# Patient Record
Sex: Female | Born: 1995 | Race: Black or African American | Hispanic: No | Marital: Single | Smoking: Never smoker
Health system: Southern US, Community
[De-identification: ages and names within clinical notes are randomized; demographics above are authoritative.]

## PROBLEM LIST (undated history)

## (undated) DIAGNOSIS — J45909 Unspecified asthma, uncomplicated: Secondary | ICD-10-CM

---
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
## 2019-08-27 ENCOUNTER — Emergency Department (HOSPITAL_COMMUNITY): Payer: BC Managed Care – PPO

## 2019-08-27 ENCOUNTER — Encounter (HOSPITAL_COMMUNITY): Payer: Self-pay | Admitting: *Deleted

## 2019-08-27 ENCOUNTER — Emergency Department (HOSPITAL_COMMUNITY)
Admission: EM | Admit: 2019-08-27 | Discharge: 2019-08-27 | Disposition: A | Discharge status: Home / Self Care | Payer: BC Managed Care – PPO | Attending: Emergency Medicine | Admitting: Emergency Medicine

## 2019-08-27 ENCOUNTER — Other Ambulatory Visit: Payer: Self-pay

## 2019-08-27 DIAGNOSIS — B373 Candidiasis of vulva and vagina: Secondary | ICD-10-CM | POA: Insufficient documentation

## 2019-08-27 DIAGNOSIS — Z79899 Other long term (current) drug therapy: Secondary | ICD-10-CM | POA: Diagnosis not present

## 2019-08-27 DIAGNOSIS — B379 Candidiasis, unspecified: Secondary | ICD-10-CM

## 2019-08-27 DIAGNOSIS — J45909 Unspecified asthma, uncomplicated: Secondary | ICD-10-CM | POA: Diagnosis not present

## 2019-08-27 DIAGNOSIS — N938 Other specified abnormal uterine and vaginal bleeding: Secondary | ICD-10-CM | POA: Insufficient documentation

## 2019-08-27 DIAGNOSIS — N939 Abnormal uterine and vaginal bleeding, unspecified: Secondary | ICD-10-CM

## 2019-08-27 HISTORY — DX: Unspecified asthma, uncomplicated: J45.909

## 2019-08-27 LAB — URINALYSIS, ROUTINE W REFLEX MICROSCOPIC
Bacteria, UA: NONE SEEN
Bilirubin Urine: NEGATIVE
Glucose, UA: NEGATIVE mg/dL
Ketones, ur: NEGATIVE mg/dL
Nitrite: NEGATIVE
Protein, ur: NEGATIVE mg/dL
RBC / HPF: >50 RBC/hpf — ABNORMAL HIGH (ref 0–5)
Specific Gravity, Urine: 1.019 (ref 1.005–1.030)
pH: 6 (ref 5.0–8.0)

## 2019-08-27 LAB — WET PREP, GENITAL
Clue Cells Wet Prep HPF POC: NONE SEEN
Sperm: NONE SEEN
Trich, Wet Prep: NONE SEEN

## 2019-08-27 LAB — COMPREHENSIVE METABOLIC PANEL
ALT: 25 U/L (ref 0–44)
AST: 23 U/L (ref 15–41)
Albumin: 3.4 g/dL — ABNORMAL LOW (ref 3.5–5.0)
Alkaline Phosphatase: 55 U/L (ref 38–126)
Anion gap: 9 (ref 5–15)
BUN: 8 mg/dL (ref 6–20)
CO2: 20 mmol/L — ABNORMAL LOW (ref 22–32)
Calcium: 8.9 mg/dL (ref 8.9–10.3)
Chloride: 107 mmol/L (ref 98–111)
Creatinine, Ser: 0.87 mg/dL (ref 0.44–1.00)
GFR calc Af Amer: >60 mL/min (ref >60)
GFR calc non Af Amer: >60 mL/min (ref >60)
Glucose, Bld: 152 mg/dL — ABNORMAL HIGH (ref 70–99)
Potassium: 3.8 mmol/L (ref 3.5–5.1)
Sodium: 136 mmol/L (ref 135–145)
Total Bilirubin: 0.2 mg/dL — ABNORMAL LOW (ref 0.3–1.2)
Total Protein: 6.8 g/dL (ref 6.5–8.1)

## 2019-08-27 LAB — CBC
HCT: 42.5 % (ref 36.0–46.0)
Hemoglobin: 13.5 g/dL (ref 12.0–15.0)
MCH: 27.3 pg (ref 26.0–34.0)
MCHC: 31.8 g/dL (ref 30.0–36.0)
MCV: 86 fL (ref 80.0–100.0)
Platelets: 396 10*3/uL (ref 150–400)
RBC: 4.94 MIL/uL (ref 3.87–5.11)
RDW: 14 % (ref 11.5–15.5)
WBC: 8.8 10*3/uL (ref 4.0–10.5)
nRBC: 0 % (ref 0.0–0.2)

## 2019-08-27 LAB — I-STAT BETA HCG BLOOD, ED (MC, WL, AP ONLY): I-stat hCG, quantitative: <5 m[IU]/mL (ref <5)

## 2019-08-27 LAB — LIPASE, BLOOD: Lipase: 33 U/L (ref 11–51)

## 2019-08-27 MED ORDER — SODIUM CHLORIDE 0.9% FLUSH: 3.0000 mL | Freq: Once | INTRAVENOUS | Status: DC

## 2019-08-27 MED ORDER — FLUCONAZOLE 150 MG PO TABS: ORAL_TABLET | ORAL | 0 refills | Status: DC

## 2019-08-27 NOTE — ED Provider Notes (Signed)
[facility] EMERGENCY DEPARTMENT Provider Note   CSN: [id number] Arrival date & time: 08/27/19  1603     History Chief Complaint  Patient presents with  . Vaginal Bleeding    Brenda Acevedo is a 24 y.o. female who presents to the ED today complaining of vaginal bleeding x 10-11 days.  She reports she began having her period on 12/30-12/31.  She states that she typically has around the 27th of every month that she was a couple of days late.  She states that since then she has been had continued bleeding although it has somewhat external waned in severity.  Patient states that she began passing very heavy clots a couple of days ago and then yesterday noticed what appeared to be a fleshy type of substance which concerned her.  She states that since yesterday she has had some suprapubic abdominal pain and pelvic pain that she describes as a pulling sensation.  She denies any new nausea or vomiting.  Patient states that she restarted her birth control at the beginning of December after being off for 6 months due to pharmacy issues with covering her birth control.  States she is sexually active with one female partner but reports that they always use protection.  She states that there could be a risk that she is pregnant but it would be very slim as they always use a condom.  She is not concerned about STIs at this time.  She denies fevers, chills, nausea, vomiting, chest pain, shortness of breath, syncope, dysuria, urinary frequency, any other associated symptoms.   The history is provided by the patient.       Past Medical History:  Diagnosis Date  . Asthma     There are no problems to display for this patient.   History reviewed. No pertinent surgical history.   OB History   No obstetric history on file.     No family history on file.  Social History   Tobacco Use  . Smoking status: Never Smoker  . Smokeless tobacco: Never Used  Substance Use Topics  .  Alcohol use: No  . Drug use: No    Home Medications Prior to Admission medications   Medication Sig Start Date End Date Taking? Authorizing Provider  albuterol (PROAIR HFA) 108 (90 BASE) MCG/ACT inhaler Inhale 2 puffs into the lungs every 6 (six) hours as needed for wheezing.    [provider]  fluconazole (DIFLUCAN) 150 MG tablet Take 1 tablet by mouth on Day 1. If symptoms persistent take 1 tablet by mouth on Day 4. 08/27/19   Tanda Rockers, PA-C  meloxicam (MOBIC) 15 MG tablet Take 1 tablet (15 mg total) by mouth daily as needed for pain. 03/19/13   Rodolph Bong, MD    Allergies    Patient has no known allergies.  Review of Systems   Review of Systems  Constitutional: Negative for chills and fever.  Respiratory: Negative for shortness of breath.   Cardiovascular: Negative for chest pain.  Gastrointestinal: Positive for abdominal pain. Negative for constipation, diarrhea, nausea and vomiting.  Genitourinary: Positive for menstrual problem, pelvic pain and vaginal bleeding. Negative for dysuria and frequency.  All other systems reviewed and are negative.   Physical Exam Updated Vital Signs BP 136/80   Pulse (!) 111   Temp 98 F (36.7 C) (Oral)   Resp 16   Ht 5\' 2"  (1.575 m)   Wt 63.5 kg   LMP 07/10/2019   SpO2  100%   BMI 25.61 kg/m   Physical Exam Vitals and nursing note reviewed.  Constitutional:      Appearance: She is not ill-appearing.  HENT:     Head: Normocephalic and atraumatic.  Eyes:     Conjunctiva/sclera: Conjunctivae normal.  Cardiovascular:     Rate and Rhythm: Normal rate and regular rhythm.     Pulses: Normal pulses.  Pulmonary:     Effort: Pulmonary effort is normal.     Breath sounds: Normal breath sounds. No wheezing, rhonchi or rales.  Abdominal:     Palpations: Abdomen is soft.     Tenderness: There is no abdominal tenderness. There is no right CVA tenderness, left CVA tenderness, guarding or rebound.  Genitourinary:    Comments:  Chaperone present for exam Gunnar Bulla, RN No rashes, lesions, or tenderness to external genitalia. No erythema, injury, or tenderness to vaginal mucosa. Small amount of blood in vaginal vault and from Os. Os is closed. Tenderness bilateral to adnexa. No CMT, cervical friability, or discharge from cervical os. Musculoskeletal:     Cervical back: Neck supple.  Skin:    General: Skin is warm and dry.  Neurological:     Mental Status: She is alert.     ED Results / Procedures / Treatments   Labs (all labs ordered are listed, but only abnormal results are displayed) Labs Reviewed  WET PREP, GENITAL - Abnormal; Notable for the following components:      Result Value   Yeast Wet Prep HPF POC PRESENT (*)    WBC, Wet Prep HPF POC FEW (*)    All other components within normal limits  COMPREHENSIVE METABOLIC PANEL - Abnormal; Notable for the following components:   CO2 20 (*)    Glucose, Bld 152 (*)    Albumin 3.4 (*)    Total Bilirubin 0.2 (*)    All other components within normal limits  URINALYSIS, ROUTINE W REFLEX MICROSCOPIC - Abnormal; Notable for the following components:   Hgb urine dipstick LARGE (*)    Leukocytes,Ua TRACE (*)    RBC / HPF >50 (*)    All other components within normal limits  LIPASE, BLOOD  CBC  I-STAT BETA HCG BLOOD, ED ([facility], [facility], [facility] ONLY)  GC/CHLAMYDIA PROBE AMP ([place]) NOT AT [facility]    EKG None  Radiology US Transvaginal Non-OB  Result Date: 08/27/2019 CLINICAL DATA:  Pelvic pain.  Abnormal uterine bleeding. EXAM: TRANSABDOMINAL AND TRANSVAGINAL ULTRASOUND OF PELVIS DOPPLER ULTRASOUND OF OVARIES TECHNIQUE: Both transabdominal and transvaginal ultrasound examinations of the pelvis were performed. Transabdominal technique was performed for global imaging of the pelvis including uterus, ovaries, adnexal regions, and pelvic cul-de-sac. It was necessary to proceed with endovaginal exam following the transabdominal exam to visualize the ovaries and  endometrial stripe. Color and duplex Doppler ultrasound was utilized to evaluate blood flow to the ovaries. COMPARISON:  None. FINDINGS: Uterus Measurements: 7.4 x 4.7 x 0.4 cm = volume: 98 mL. No fibroids or other mass visualized. Endometrium Thickness: 21 mm.  No focal abnormality visualized. Right ovary Measurements: 3.5 x 1.6 x 2.5 cm = volume: 7 point mL. Normal appearance/no adnexal mass. Left ovary Measurements: 2.9 x 1.7 x 2.6 cm = volume: 6.7 mL. Normal appearance/no adnexal mass. Pulsed Doppler evaluation of both ovaries demonstrates normal low-resistance arterial and venous waveforms. Other findings There is a small amount of free fluid about the right adnexa. IMPRESSION: 1. No evidence for ovarian torsion. 2. Thickened endometrial stripe measuring approximately  2.1 cm. Consider follow-up pelvic ultrasound in 6-8 weeks in the early proliferative phase of the menstrual cycle after initiation of hormonal or medical therapy. If the abnormal uterine bleeding is already refractory to hormonal or medical therapy, consider endometrial biopsy, as clinically warranted. Electronically Signed   By: Katherine Mantle M.D.   On: 08/27/2019 18:44   US Pelvis Complete  Result Date: 08/27/2019 CLINICAL DATA:  Pelvic pain.  Abnormal uterine bleeding. EXAM: TRANSABDOMINAL AND TRANSVAGINAL ULTRASOUND OF PELVIS DOPPLER ULTRASOUND OF OVARIES TECHNIQUE: Both transabdominal and transvaginal ultrasound examinations of the pelvis were performed. Transabdominal technique was performed for global imaging of the pelvis including uterus, ovaries, adnexal regions, and pelvic cul-de-sac. It was necessary to proceed with endovaginal exam following the transabdominal exam to visualize the ovaries and endometrial stripe. Color and duplex Doppler ultrasound was utilized to evaluate blood flow to the ovaries. COMPARISON:  None. FINDINGS: Uterus Measurements: 7.4 x 4.7 x 0.4 cm = volume: 98 mL. No fibroids or other mass visualized.  Endometrium Thickness: 21 mm.  No focal abnormality visualized. Right ovary Measurements: 3.5 x 1.6 x 2.5 cm = volume: 7 point mL. Normal appearance/no adnexal mass. Left ovary Measurements: 2.9 x 1.7 x 2.6 cm = volume: 6.7 mL. Normal appearance/no adnexal mass. Pulsed Doppler evaluation of both ovaries demonstrates normal low-resistance arterial and venous waveforms. Other findings There is a small amount of free fluid about the right adnexa. IMPRESSION: 1. No evidence for ovarian torsion. 2. Thickened endometrial stripe measuring approximately 2.1 cm. Consider follow-up pelvic ultrasound in 6-8 weeks in the early proliferative phase of the menstrual cycle after initiation of hormonal or medical therapy. If the abnormal uterine bleeding is already refractory to hormonal or medical therapy, consider endometrial biopsy, as clinically warranted. Electronically Signed   By: Katherine Mantle M.D.   On: 08/27/2019 18:44   [place] Art/Ven Flow Abd Pelv Doppler  Result Date: 08/27/2019 CLINICAL DATA:  Pelvic pain.  Abnormal uterine bleeding. EXAM: TRANSABDOMINAL AND TRANSVAGINAL ULTRASOUND OF PELVIS DOPPLER ULTRASOUND OF OVARIES TECHNIQUE: Both transabdominal and transvaginal ultrasound examinations of the pelvis were performed. Transabdominal technique was performed for global imaging of the pelvis including uterus, ovaries, adnexal regions, and pelvic cul-de-sac. It was necessary to proceed with endovaginal exam following the transabdominal exam to visualize the ovaries and endometrial stripe. Color and duplex Doppler ultrasound was utilized to evaluate blood flow to the ovaries. COMPARISON:  None. FINDINGS: Uterus Measurements: 7.4 x 4.7 x 0.4 cm = volume: 98 mL. No fibroids or other mass visualized. Endometrium Thickness: 21 mm.  No focal abnormality visualized. Right ovary Measurements: 3.5 x 1.6 x 2.5 cm = volume: 7 point mL. Normal appearance/no adnexal mass. Left ovary Measurements: 2.9 x 1.7 x 2.6 cm = volume:  6.7 mL. Normal appearance/no adnexal mass. Pulsed Doppler evaluation of both ovaries demonstrates normal low-resistance arterial and venous waveforms. Other findings There is a small amount of free fluid about the right adnexa. IMPRESSION: 1. No evidence for ovarian torsion. 2. Thickened endometrial stripe measuring approximately 2.1 cm. Consider follow-up pelvic ultrasound in 6-8 weeks in the early proliferative phase of the menstrual cycle after initiation of hormonal or medical therapy. If the abnormal uterine bleeding is already refractory to hormonal or medical therapy, consider endometrial biopsy, as clinically warranted. Electronically Signed   By: Katherine Mantle M.D.   On: 08/27/2019 18:44    Procedures Procedures (including critical care time)  Medications Ordered in ED Medications  sodium chloride flush (NS) 0.9 % injection 3 mL (  has no administration in time range)    ED Course  I have reviewed the triage vital signs and the nursing notes.  Pertinent labs & imaging results that were available during my care of the patient were reviewed by me and considered in my medical decision making (see chart for details).  24 year old female presents to the ED today complaining of excessive vaginal bleeding for the past 10 to 11 days.  Noticed a fleshy substance yesterday which concerned her.  Patient is sexually active with one female partner but states they always use protection.  She is going through about 5-6 medium-size pads per day.  Had recently restart birth control at the beginning of December after being on for 6 months. Pt without any symptoms regarding blood loss including chest pain, SOB, dizziness/lightheadedness.   On exam patient has no abdominal tenderness.  She has bilateral adnexal tenderness on pelvic exam.  There is no obvious vaginal discharge appreciated.  There is a small amount of blood in the vault and from os although no large blood clots or products of conception  visualized.  Os is closed.  hCG negative today.   CBC without leukocytosis.  Hemoglobin stable at 13.5.  No electrolyte abnormalities on CMP today.  Lipase within normal limits.  Awaiting urinalysis, wet prep.  Patient declined HIV and syphilis testing today.   Given tenderness to both adnexa will obtain pelvic ultrasound at this time, question history of ovarian cyst with possible hemorrhagic ruptured cyst versus other etiology.  Patient does not have any CMT tenderness to suggest PID.  Again she is not pregnant I am not concerned about an ectopic pregnancy today.   Wet prep positive for yeast; will treat.   Ultrasound with endometrial stripe measuring 2.1 cm, recommend repeat imaging in 6-8 weeks. Will have patient follow up with OBGYN regarding dysfunctional uterine bleeding and enlarged endometrial stripe. Given she is not having any symptoms related to her blood loss and her hgb is stable feel she is stable for follow up outpatient. Strict return precautions discussed. Pt is in agreement with plan and stable for discharge home.   Clinical Course as of Aug 27 1851  Sun Aug 27, 2019  1737 I-stat hCG, quantitative: <5.0 [MV]  1737 Hemoglobin: 13.5 [MV]  1754 Yeast Wet Prep HPF POC(!): PRESENT [MV]    Clinical Course User Index [MV] Eustaquio Maize, PA-C   MDM Rules/Calculators/A&P                       Final Clinical Impression(s) / ED Diagnoses Final diagnoses:  Vaginal bleeding  Abnormal uterine bleeding  Yeast infection    Rx / DC Orders ED Discharge Orders         Ordered    fluconazole (DIFLUCAN) 150 MG tablet     08/27/19 1803           Discharge Instructions     Your labwork was reassuring today. Your ultrasound did show an enlarged endometrial lining; they recommend that you have a repeat ultrasound in 6-8 weeks.   Please follow up with OBGYN regarding your excessive bleeding. [facility] - please call to find out when their walk in  hours are.   I have sent in medication for a yeast infection as you tested positive for this today. Please take as prescribed.   You have been tested for gonorrhea and chlamydia and will receive a call in 2-3 days IF you test  positive.   Return to the ED for any worsening symptoms including worsening pain, dizziness/lightheadedness, passing out, blurry vision/double vision, shortness of breath.        Tanda RockersVenter, Pamalee Marcoe, PA-C 08/27/19 1853    Jacalyn LefevreHaviland, Julie, MD 08/28/19 0001

## 2019-08-27 NOTE — ED Triage Notes (Signed)
The pt is c/o vaginal bleeding for 12 days with clotting  Lower abd pain   She was off birth control pills for 6 months then in December she started taking an old rx of brith control pills    Her last bc pills was 2 days ago  Last normal period was the end of november

## 2019-08-27 NOTE — Discharge Instructions (Addendum)
Your labwork was reassuring today. Your ultrasound did show an enlarged endometrial lining; they recommend that you have a repeat ultrasound in 6-8 weeks.   Please follow up with OBGYN regarding your excessive bleeding. [facility] - please call to find out when their walk in hours are.   I have sent in medication for a yeast infection as you tested positive for this today. Please take as prescribed.   You have been tested for gonorrhea and chlamydia and will receive a call in 2-3 days IF you test positive.   Return to the ED for any worsening symptoms including worsening pain, dizziness/lightheadedness, passing out, blurry vision/double vision, shortness of breath.

## 2019-08-29 LAB — GC/CHLAMYDIA PROBE AMP (~~LOC~~) NOT AT ARMC
Chlamydia: POSITIVE — AB
Neisseria Gonorrhea: NEGATIVE

## 2019-08-30 ENCOUNTER — Telehealth: Payer: Self-pay | Admitting: Medical

## 2019-08-30 DIAGNOSIS — A749 Chlamydial infection, unspecified: Secondary | ICD-10-CM

## 2019-08-30 MED ORDER — AZITHROMYCIN 250 MG PO TABS: 1000.0000 mg | ORAL_TABLET | Freq: Once | ORAL | 0 refills | Status: AC

## 2019-08-30 NOTE — Telephone Encounter (Addendum)
Brenda Acevedo tested positive for  Chlamydia. Patient was called by RN and allergies and pharmacy confirmed. Rx sent to pharmacy of choice.   Marny Lowenstein, PA-C 08/30/2019 2:56 PM       ----- Message from Kathe Becton, RN sent at 08/30/2019  1:46 PM EST ----- This patient tested positive for :  Chlamydia   She "has NKDA",  I have informed the patient of her results and confirmed her pharmacy is correct in her chart. Please send Rx.   Thank you,   Kathe Becton, RN   Results faxed to [facility] Department.

## 2020-07-17 ENCOUNTER — Inpatient Hospital Stay (HOSPITAL_COMMUNITY)
Admission: EM | Admit: 2020-07-17 | Discharge: 2020-07-19 | DRG: 203 | Disposition: A | Discharge status: Home / Self Care | Payer: BC Managed Care – PPO | Attending: Internal Medicine | Admitting: Internal Medicine

## 2020-07-17 ENCOUNTER — Encounter (HOSPITAL_COMMUNITY): Payer: Self-pay | Admitting: *Deleted

## 2020-07-17 ENCOUNTER — Other Ambulatory Visit: Payer: Self-pay

## 2020-07-17 ENCOUNTER — Emergency Department (HOSPITAL_COMMUNITY): Payer: BC Managed Care – PPO

## 2020-07-17 DIAGNOSIS — J449 Chronic obstructive pulmonary disease, unspecified: Secondary | ICD-10-CM | POA: Diagnosis present

## 2020-07-17 DIAGNOSIS — R062 Wheezing: Secondary | ICD-10-CM

## 2020-07-17 DIAGNOSIS — J45902 Unspecified asthma with status asthmaticus: Secondary | ICD-10-CM | POA: Diagnosis not present

## 2020-07-17 DIAGNOSIS — R0602 Shortness of breath: Secondary | ICD-10-CM | POA: Diagnosis not present

## 2020-07-17 DIAGNOSIS — D72829 Elevated white blood cell count, unspecified: Secondary | ICD-10-CM | POA: Diagnosis present

## 2020-07-17 DIAGNOSIS — Z91013 Allergy to seafood: Secondary | ICD-10-CM

## 2020-07-17 DIAGNOSIS — Z20822 Contact with and (suspected) exposure to covid-19: Secondary | ICD-10-CM | POA: Diagnosis present

## 2020-07-17 DIAGNOSIS — J45901 Unspecified asthma with (acute) exacerbation: Secondary | ICD-10-CM | POA: Diagnosis present

## 2020-07-17 DIAGNOSIS — Z79899 Other long term (current) drug therapy: Secondary | ICD-10-CM

## 2020-07-17 DIAGNOSIS — Z7952 Long term (current) use of systemic steroids: Secondary | ICD-10-CM

## 2020-07-17 DIAGNOSIS — R0902 Hypoxemia: Secondary | ICD-10-CM | POA: Diagnosis present

## 2020-07-17 LAB — BASIC METABOLIC PANEL
Anion gap: 15 (ref 5–15)
BUN: 13 mg/dL (ref 6–20)
CO2: 20 mmol/L — ABNORMAL LOW (ref 22–32)
Calcium: 9.6 mg/dL (ref 8.9–10.3)
Chloride: 100 mmol/L (ref 98–111)
Creatinine, Ser: 1.08 mg/dL — ABNORMAL HIGH (ref 0.44–1.00)
GFR, Estimated: >60 mL/min (ref >60)
Glucose, Bld: 153 mg/dL — ABNORMAL HIGH (ref 70–99)
Potassium: 3.4 mmol/L — ABNORMAL LOW (ref 3.5–5.1)
Sodium: 135 mmol/L (ref 135–145)

## 2020-07-17 LAB — CBC WITH DIFFERENTIAL/PLATELET
Abs Immature Granulocytes: 0.18 10*3/uL — ABNORMAL HIGH (ref 0.00–0.07)
Basophils Absolute: 0 10*3/uL (ref 0.0–0.1)
Basophils Relative: 0 %
Eosinophils Absolute: 0 10*3/uL (ref 0.0–0.5)
Eosinophils Relative: 0 %
HCT: 41.6 % (ref 36.0–46.0)
Hemoglobin: 13.4 g/dL (ref 12.0–15.0)
Immature Granulocytes: 1 %
Lymphocytes Relative: 3 %
Lymphs Abs: 0.8 10*3/uL (ref 0.7–4.0)
MCH: 26 pg (ref 26.0–34.0)
MCHC: 32.2 g/dL (ref 30.0–36.0)
MCV: 80.6 fL (ref 80.0–100.0)
Monocytes Absolute: 0.5 10*3/uL (ref 0.1–1.0)
Monocytes Relative: 2 %
Neutro Abs: 23.3 10*3/uL — ABNORMAL HIGH (ref 1.7–7.7)
Neutrophils Relative %: 94 %
Platelets: 410 10*3/uL — ABNORMAL HIGH (ref 150–400)
RBC: 5.16 MIL/uL — ABNORMAL HIGH (ref 3.87–5.11)
RDW: 15.6 % — ABNORMAL HIGH (ref 11.5–15.5)
WBC: 24.8 10*3/uL — ABNORMAL HIGH (ref 4.0–10.5)
nRBC: 0 % (ref 0.0–0.2)

## 2020-07-17 LAB — RESP PANEL BY RT-PCR (FLU A&B, COVID) ARPGX2
Influenza A by PCR: NEGATIVE
Influenza B by PCR: NEGATIVE
SARS Coronavirus 2 by RT PCR: NEGATIVE

## 2020-07-17 LAB — I-STAT BETA HCG BLOOD, ED (MC, WL, AP ONLY): I-stat hCG, quantitative: <5 m[IU]/mL (ref <5)

## 2020-07-17 LAB — D-DIMER, QUANTITATIVE: D-Dimer, Quant: 0.43 ug/mL-FEU (ref 0.00–0.50)

## 2020-07-17 MED ORDER — IPRATROPIUM BROMIDE 0.02 % IN SOLN
0.5000 mg | Freq: Once | RESPIRATORY_TRACT | Status: AC
  Administered 2020-07-17: 0.5 mg via RESPIRATORY_TRACT
  Filled 2020-07-17: qty 2.5

## 2020-07-17 MED ORDER — ENOXAPARIN SODIUM 40 MG/0.4ML ~~LOC~~ SOLN
40.0000 mg | SUBCUTANEOUS | Status: DC
  Administered 2020-07-18 – 2020-07-19 (×2): 40 mg via SUBCUTANEOUS
  Filled 2020-07-17 (×2): qty 0.4

## 2020-07-17 MED ORDER — LEVALBUTEROL HCL 0.63 MG/3ML IN NEBU
0.6300 mg | INHALATION_SOLUTION | RESPIRATORY_TRACT | Status: DC | PRN
  Administered 2020-07-18: 0.63 mg via RESPIRATORY_TRACT
  Filled 2020-07-17: qty 3

## 2020-07-17 MED ORDER — MAGNESIUM SULFATE 2 GM/50ML IV SOLN
2.0000 g | Freq: Once | INTRAVENOUS | Status: AC
  Administered 2020-07-17: 2 g via INTRAVENOUS
  Filled 2020-07-17: qty 50

## 2020-07-17 MED ORDER — METHYLPREDNISOLONE SODIUM SUCC 125 MG IJ SOLR
60.0000 mg | Freq: Two times a day (BID) | INTRAMUSCULAR | Status: DC
  Administered 2020-07-18: 60 mg via INTRAVENOUS
  Filled 2020-07-17: qty 2

## 2020-07-17 MED ORDER — MOMETASONE FURO-FORMOTEROL FUM 200-5 MCG/ACT IN AERO
2.0000 | INHALATION_SPRAY | Freq: Two times a day (BID) | RESPIRATORY_TRACT | Status: DC
  Administered 2020-07-18 – 2020-07-19 (×3): 2 via RESPIRATORY_TRACT
  Filled 2020-07-17: qty 8.8

## 2020-07-17 NOTE — ED Triage Notes (Signed)
BIB EMS tested yesterday Covid (-), breathing worse today, UC gave 3 breathing nebs, Sats 92-93 with movement. 18 L AC 125 mg Solumedrol. EMS gave 1 duo neb en route. 140-140/64-24-96% RA

## 2020-07-17 NOTE — H&P (Signed)
History and Physical    Brenda Acevedo HYQ:[id number] DOB: March 02, 1996 DOA: 07/17/2020  PCP: Patient, No Pcp Per  Patient coming from: Home  I have personally briefly reviewed patient's old medical records in [facility]  Chief Complaint: Asthma  HPI: Brenda Acevedo is a 24 y.o. female with medical history significant of asthma.  Pt having asthma attack with severe wheezing for past several days.  Given prednisone and inhalers x3 days ago.  Despite taking these, symptoms worsened and became severe earlier today.  Went in to UC.  Neb treatments, solumedrol given.  Symptoms persisted.  In to ED.  Neg outpt COVID test.  Not vaccinated.  ED Course: More neb treatments, magnesium.  Symptoms slightly better but desats with ambulation and S.Tach to 140.  Neg COVID and flu tests here.  CXR shows mild airway thickening and suggests acute or chronic reactive airway disease.  Is on hormonal birth control pill, non smoker.  No leg pain or swelling, no recent travel or prolonged immobility.   Review of Systems: As per HPI, otherwise all review of systems negative.  Past Medical History:  Diagnosis Date  . Asthma     History reviewed. No pertinent surgical history.   reports that she has never smoked. She has never used smokeless tobacco. She reports current alcohol use. She reports that she does not use drugs.  Allergies  Allergen Reactions  . Coconut (Cocos Nucifera) Allergy Skin Test Other (See Comments)  . Justicia Adhatoda (Malabar Nut Tree) [Justicia Adhatoda] Other (See Comments)    Tree nuts  . Shellfish Allergy Other (See Comments)    No family history on file. No sick contacts, no h/o clotting disorder.  Prior to Admission medications   Medication Sig Start Date End Date Taking? Authorizing Provider  albuterol (PROAIR HFA) 108 (90 BASE) MCG/ACT inhaler Inhale 2 puffs into the lungs every 6 (six) hours as needed for wheezing.   Yes [provider]    azithromycin (ZITHROMAX) 250 MG tablet Take 500 mg by mouth daily. Take for 5 days.   Yes [provider]  dexamethasone (DECADRON) 4 MG tablet Take 4-12 mg by mouth See admin instructions. Take 12mg  for 4 days, Then 8mg  for 4 day, Then 4mg  for 4 days.   Yes [provider]    Physical Exam: Vitals:   07/17/20 2145 07/17/20 2200 07/17/20 2205 07/17/20 2206  BP: 111/79 100/79 116/87   Pulse: (!) 125 (!) 130  (!) 128  Resp:    (!) 21  Temp:      TempSrc:      SpO2: 94% 93%  95%  Weight:      Height:        Constitutional: NAD, calm, comfortable Eyes: PERRL, lids and conjunctivae normal ENMT: Mucous membranes are moist. Posterior pharynx clear of any exudate or lesions.Normal dentition.  Neck: normal, supple, no masses, no thyromegaly Respiratory: clear to auscultation bilaterally, no wheezing, no crackles. Normal respiratory effort. No accessory muscle use.  Cardiovascular: Regular rate and rhythm, no murmurs / rubs / gallops. No extremity edema. 2+ pedal pulses. No carotid bruits.  Abdomen: no tenderness, no masses palpated. No hepatosplenomegaly. Bowel sounds positive.  Musculoskeletal: no clubbing / cyanosis. No joint deformity upper and lower extremities. Good ROM, no contractures. Normal muscle tone.  Skin: no rashes, lesions, ulcers. No induration Neurologic: CN 2-12 grossly intact. Sensation intact, DTR normal. Strength 5/5 in all 4.  Psychiatric: Normal judgment and insight. Alert and oriented x 3. Normal  mood.    Labs on Admission: I have personally reviewed following labs and imaging studies  CBC: Recent Labs  Lab 07/17/20 1920  WBC 24.8*  NEUTROABS 23.3*  HGB 13.4  HCT 41.6  MCV 80.6  PLT 410*   Basic Metabolic Panel: Recent Labs  Lab 07/17/20 1920  NA 135  K 3.4*  CL 100  CO2 20*  GLUCOSE 153*  BUN 13  CREATININE 1.08*  CALCIUM 9.6   GFR: Estimated Creatinine Clearance: 74.3 mL/min (A) (by C-G formula based on SCr of 1.08 mg/dL  (H)). Liver Function Tests: No results for input(s): AST, ALT, ALKPHOS, BILITOT, PROT, ALBUMIN in the last 168 hours. No results for input(s): LIPASE, AMYLASE in the last 168 hours. No results for input(s): AMMONIA in the last 168 hours. Coagulation Profile: No results for input(s): INR, PROTIME in the last 168 hours. Cardiac Enzymes: No results for input(s): CKTOTAL, CKMB, CKMBINDEX, TROPONINI in the last 168 hours. BNP (last 3 results) No results for input(s): PROBNP in the last 8760 hours. HbA1C: No results for input(s): HGBA1C in the last 72 hours. CBG: No results for input(s): GLUCAP in the last 168 hours. Lipid Profile: No results for input(s): CHOL, HDL, LDLCALC, TRIG, CHOLHDL, LDLDIRECT in the last 72 hours. Thyroid Function Tests: No results for input(s): TSH, T4TOTAL, FREET4, T3FREE, THYROIDAB in the last 72 hours. Anemia Panel: No results for input(s): VITAMINB12, FOLATE, FERRITIN, TIBC, IRON, RETICCTPCT in the last 72 hours. Urine analysis:    Component Value Date/Time   COLORURINE YELLOW 08/27/2019 1722   APPEARANCEUR CLEAR 08/27/2019 1722   LABSPEC 1.019 08/27/2019 1722   PHURINE 6.0 08/27/2019 1722   GLUCOSEU NEGATIVE 08/27/2019 1722   HGBUR LARGE (A) 08/27/2019 1722   BILIRUBINUR NEGATIVE 08/27/2019 1722   KETONESUR NEGATIVE 08/27/2019 1722   PROTEINUR NEGATIVE 08/27/2019 1722   NITRITE NEGATIVE 08/27/2019 1722   LEUKOCYTESUR TRACE (A) 08/27/2019 1722    Radiological Exams on Admission: DG Chest 2 View  Result Date: 07/17/2020 CLINICAL DATA:  Shortness of breath, COVID-19 negative EXAM: CHEST - 2 VIEW COMPARISON:  Radiograph 04/26/2020 FINDINGS: Mild airways thickening. No consolidation, features of edema, pneumothorax, or effusion. The cardiomediastinal contours are unremarkable. Telemetry leads overlie the chest. No acute osseous or soft tissue abnormality. IMPRESSION: Mild airways thickening, can be seen in the setting of acute or chronic reactive airways  disease. Electronically Signed   By: Kreg Shropshire M.D.   On: 07/17/2020 18:51    EKG: Independently reviewed.  Assessment/Plan Principal Problem:   Status asthmaticus    1. Status asthmaticus - 1. COPD pathway 2. Solumedrol 60mg  IV Q12H 3. xopenex PRN 4. Scheduled LABA/inh steroid 5. Tele monitor for tachycardia 2. Checking D.Dimer - if elevated probably should get CTA chest to r/o PE.  DVT prophylaxis: Lovenox Code Status: Full Family Communication: Family at bedside Disposition Plan: Home after breathing improved Consults called: None Admission status: Place in obs - probably qualifies for IP status though as she has clearly failed outpt management of asthma including systemic steroids.    Jahid Weida DO [facility]  How to contact the [facility] Attending or Consulting provider 7A - 7P or covering provider during after hours 7P -7A, for this patient?  1. Check the care team in [facility] and look for a) attending/consulting [facility] provider listed and b) the [facility] team listed 2. Log into [email or web address]  Amion Physician Scheduling and messaging for groups and whole hospitals  On call and physician scheduling software for group practices,  residents, hospitalists and other medical providers for call, clinic, rotation and shift schedules. OnCall Enterprise is a hospital-wide system for scheduling doctors and paging doctors on call. EasyPlot is for scientific plotting and data analysis.  [email or web address]  and use [place]'s universal password to access. If you do not have the password, please contact the hospital operator.  3. Locate the [facility] provider you are looking for under [facility] and page to a number that you can be directly reached. 4. If you still have difficulty reaching the provider, please page the [facility] (Director on Call) for the Hospitalists listed on amion for assistance.  07/17/2020, 10:38 PM

## 2020-07-17 NOTE — ED Provider Notes (Signed)
[facility]-EMERGENCY DEPT Provider Note   CSN: [id number] Arrival date & time: 07/17/20  1729     History Chief Complaint  Patient presents with  . Asthma    Brenda Acevedo is a 24 y.o. female.  HPI Patient is a 23 year old female with a history of asthma.  Patient states for the past few days she has been experiencing intermittent episodes of worsening wheezing and shortness of breath.  She was initially evaluated yesterday and started on steroids.  Her symptoms resolved.  Today her symptoms worsen once again.  She feels that this started about 5 hours ago.  She went to urgent care was found to be hypoxic in the low 90s on room air.  She was given 3 nebulizer treatments as well as 125 mg of IV Solu-Medrol.  EMS also gave the patient 1 DuoNeb in route.  Patient reports moderate relief of her symptoms.  Her oxygen saturations are now about 95 to 96% on room air.  Patient states that she uses ProAir at home for her symptoms.  She states about once per year she will have an exacerbation that requires a nebulizer as well as steroids but typically does not experience exacerbations of this severity.  She states she was hospitalized once when she was a teenager for an asthma exacerbation.  No history of intubation.  She tested negative for COVID-19 today while in urgent care, per patient.  No fevers, rhinorrhea, sore throat, abdominal pain, urinary changes, leg swelling.  No history of DVT/PE.     Past Medical History:  Diagnosis Date  . Asthma     There are no problems to display for this patient.   History reviewed. No pertinent surgical history.   OB History   No obstetric history on file.     No family history on file.  Social History   Tobacco Use  . Smoking status: Never Smoker  . Smokeless tobacco: Never Used  Substance Use Topics  . Alcohol use: Yes    Comment: occ  . Drug use: No    Home Medications Prior to Admission medications     Medication Sig Start Date End Date Taking? Authorizing Provider  albuterol (PROAIR HFA) 108 (90 BASE) MCG/ACT inhaler Inhale 2 puffs into the lungs every 6 (six) hours as needed for wheezing.    [provider]  fluconazole (DIFLUCAN) 150 MG tablet Take 1 tablet by mouth on Day 1. If symptoms persistent take 1 tablet by mouth on Day 4. 08/27/19   Tanda Rockers, PA-C  meloxicam (MOBIC) 15 MG tablet Take 1 tablet (15 mg total) by mouth daily as needed for pain. 03/19/13   Rodolph Bong, MD    Allergies    Patient has no known allergies.  Review of Systems   Review of Systems  All other systems reviewed and are negative. Ten systems reviewed and are negative for acute change, except as noted in the HPI.   Physical Exam Updated Vital Signs BP 129/68 (BP Location: Right Arm)   Pulse (!) 144   Temp 97.9 F (36.6 C) (Oral)   Resp (!) 24   Ht 5\' 2"  (1.575 m)   Wt 72.6 kg   SpO2 94%   BMI 29.26 kg/m   Physical Exam Vitals and nursing note reviewed.  Constitutional:      General: She is not in acute distress.    Appearance: Normal appearance. She is not ill-appearing, toxic-appearing or diaphoretic.  HENT:  Head: Normocephalic and atraumatic.     Right Ear: External ear normal.     Left Ear: External ear normal.     Nose: Nose normal.     Mouth/Throat:     Mouth: Mucous membranes are moist.     Pharynx: Oropharynx is clear. No oropharyngeal exudate or posterior oropharyngeal erythema.  Eyes:     General: No scleral icterus.       Right eye: No discharge.        Left eye: No discharge.     Extraocular Movements: Extraocular movements intact.     Conjunctiva/sclera: Conjunctivae normal.  Cardiovascular:     Rate and Rhythm: Regular rhythm. Tachycardia present.     Pulses: Normal pulses.     Heart sounds: Normal heart sounds. No murmur heard.  No friction rub. No gallop.      Comments: Tachycardic around 145 bpm.  No murmurs, rubs, gallops.  Patient received multiple  albuterol treatments prior to arrival. Pulmonary:     Effort: Pulmonary effort is normal. No respiratory distress.     Breath sounds: No stridor. Wheezing present. No rhonchi or rales.     Comments: Oxygen saturations around 95% on room air.  Diffuse expiratory expiratory wheezes noted bilaterally. Abdominal:     General: Abdomen is flat.     Palpations: Abdomen is soft.     Tenderness: There is no abdominal tenderness.  Musculoskeletal:        General: Normal range of motion.     Cervical back: Normal range of motion and neck supple. No tenderness.  Skin:    General: Skin is warm and dry.  Neurological:     General: No focal deficit present.     Mental Status: She is alert and oriented to person, place, and time.  Psychiatric:        Mood and Affect: Mood normal.        Behavior: Behavior normal.    ED Results / Procedures / Treatments   Labs (all labs ordered are listed, but only abnormal results are displayed) Labs Reviewed  BASIC METABOLIC PANEL - Abnormal; Notable for the following components:      Result Value   Potassium 3.4 (*)    CO2 20 (*)    Glucose, Bld 153 (*)    Creatinine, Ser 1.08 (*)    All other components within normal limits  CBC WITH DIFFERENTIAL/PLATELET - Abnormal; Notable for the following components:   WBC 24.8 (*)    RBC 5.16 (*)    RDW 15.6 (*)    Platelets 410 (*)    Neutro Abs 23.3 (*)    Abs Immature Granulocytes 0.18 (*)    All other components within normal limits  RESP PANEL BY RT-PCR (FLU A&B, COVID) ARPGX2  HIV ANTIBODY (ROUTINE TESTING W REFLEX)  D-DIMER, QUANTITATIVE (NOT AT [facility])  I-STAT BETA HCG BLOOD, ED ([facility], [facility], [facility] ONLY)    EKG None  Radiology DG Chest 2 View  Result Date: 07/17/2020 CLINICAL DATA:  Shortness of breath, COVID-19 negative EXAM: CHEST - 2 VIEW COMPARISON:  Radiograph 04/26/2020 FINDINGS: Mild airways thickening. No consolidation, features of edema, pneumothorax, or effusion. The cardiomediastinal contours are  unremarkable. Telemetry leads overlie the chest. No acute osseous or soft tissue abnormality. IMPRESSION: Mild airways thickening, can be seen in the setting of acute or chronic reactive airways disease. Electronically Signed   By: Kreg Shropshire M.D.   On: 07/17/2020 18:51    Procedures Procedures (including critical care  time)  Medications Ordered in ED Medications  methylPREDNISolone sodium succinate (SOLU-MEDROL) 125 mg/2 mL injection 60 mg (has no administration in time range)  enoxaparin (LOVENOX) injection 40 mg (has no administration in time range)  mometasone-formoterol (DULERA) 200-5 MCG/ACT inhaler 2 puff (has no administration in time range)  levalbuterol (XOPENEX) nebulizer solution 0.63 mg (has no administration in time range)  magnesium sulfate IVPB 2 g 50 mL (0 g Intravenous Stopped 07/17/20 2021)  ipratropium (ATROVENT) nebulizer solution 0.5 mg (0.5 mg Nebulization Given 07/17/20 2008)    ED Course  I have reviewed the triage vital signs and the nursing notes.  Pertinent labs & imaging results that were available during my care of the patient were reviewed by me and considered in my medical decision making (see chart for details).  Clinical Course as of Jul 18 2251  Wed Jul 17, 2020  2105 WBC(!): 24.8 [LJ]  2105 NEUT#(!): 23.3 [LJ]  2105 Likely reactive leukocytosis and neutrophilia secondary to repeat NS as well as p.o. and IV steroids.   [LJ]  2107 Patient reassessed after being given Atrovent as well as magnesium sulfate.  She states she is feeling a bit better.  She has continued mild wheezing.  Oxygen saturations in the mid 90s on room air.  Patient still tachycardic in the 130s as well as mildly tachypneic.  She was ambulated by the nursing staff and found to be tachycardic, tachypneic, and now complains of some worsening central chest tightness with ambulation down the hall.  Oxygen saturations briefly dropped into the 80s.   [LJ]    Clinical Course User Index [LJ]  Placido Sou, PA-C   MDM Rules/Calculators/A&P                          Patient is a 24 year old female who presents to the emergency department due to an asthma exacerbation that started a few days ago.  Her symptoms have been intermittent and she was evaluated for these yesterday and started on oral steroids.  Her symptoms started again this morning and she was evaluated at urgent care and found to be hypoxic with refractory symptoms and after multiple rounds of albuterol she was then sent to the emergency department via EMS.  She was given another DuoNeb in route.  Upon arrival patient was found to have significant inspiratory and expiratory wheezes in the bilateral posterior lung fields.  She was given Atrovent as well as magnesium sulfate.  This mildly improved her symptoms but she has had continued wheezing.  She was ambulated in the hall by the nursing staff and began once again having chest tightness and desaturated into the high 80s briefly.  Both myself as well as my attending physician feel that patient requires admission for continued medical management as well as overnight observation.  This was discussed with the patient and she is amenable with this plan.  We will discussed with the hospitalist for likely admission.  Respiratory panel obtained and is negative.  Final Clinical Impression(s) / ED Diagnoses Final diagnoses:  Moderate asthma with exacerbation, unspecified whether persistent  Shortness of breath  Wheezing    Rx / DC Orders ED Discharge Orders    None       Placido Sou, PA-C 07/17/20 2253    Gwyneth Sprout, MD 07/21/20 [phone]

## 2020-07-17 NOTE — ED Notes (Signed)
Patient ambulated in the hall approximately 180 ft. While walking the patients sats remained between 92- 94% on room air.  Her HR got as high as 140. Once the patient got back into the room, she complained of chest tightness and sats dropped as low as 87%. MD and PA made aware.

## 2020-07-18 ENCOUNTER — Encounter (HOSPITAL_COMMUNITY): Payer: Self-pay | Admitting: Internal Medicine

## 2020-07-18 DIAGNOSIS — J45901 Unspecified asthma with (acute) exacerbation: Secondary | ICD-10-CM | POA: Diagnosis present

## 2020-07-18 DIAGNOSIS — R0902 Hypoxemia: Secondary | ICD-10-CM | POA: Diagnosis present

## 2020-07-18 DIAGNOSIS — Z20822 Contact with and (suspected) exposure to covid-19: Secondary | ICD-10-CM | POA: Diagnosis present

## 2020-07-18 DIAGNOSIS — Z91013 Allergy to seafood: Secondary | ICD-10-CM | POA: Diagnosis not present

## 2020-07-18 DIAGNOSIS — D72829 Elevated white blood cell count, unspecified: Secondary | ICD-10-CM | POA: Diagnosis not present

## 2020-07-18 DIAGNOSIS — J449 Chronic obstructive pulmonary disease, unspecified: Secondary | ICD-10-CM | POA: Diagnosis present

## 2020-07-18 DIAGNOSIS — J45902 Unspecified asthma with status asthmaticus: Secondary | ICD-10-CM | POA: Diagnosis not present

## 2020-07-18 DIAGNOSIS — Z79899 Other long term (current) drug therapy: Secondary | ICD-10-CM | POA: Diagnosis not present

## 2020-07-18 DIAGNOSIS — R0602 Shortness of breath: Secondary | ICD-10-CM | POA: Diagnosis present

## 2020-07-18 DIAGNOSIS — Z7952 Long term (current) use of systemic steroids: Secondary | ICD-10-CM | POA: Diagnosis not present

## 2020-07-18 LAB — COMPREHENSIVE METABOLIC PANEL
ALT: 30 U/L (ref 0–44)
AST: 24 U/L (ref 15–41)
Albumin: 4.3 g/dL (ref 3.5–5.0)
Alkaline Phosphatase: 85 U/L (ref 38–126)
Anion gap: 10 (ref 5–15)
BUN: 14 mg/dL (ref 6–20)
CO2: 23 mmol/L (ref 22–32)
Calcium: 9.3 mg/dL (ref 8.9–10.3)
Chloride: 105 mmol/L (ref 98–111)
Creatinine, Ser: 0.85 mg/dL (ref 0.44–1.00)
GFR, Estimated: >60 mL/min (ref >60)
Glucose, Bld: 127 mg/dL — ABNORMAL HIGH (ref 70–99)
Potassium: 4.5 mmol/L (ref 3.5–5.1)
Sodium: 138 mmol/L (ref 135–145)
Total Bilirubin: 0.6 mg/dL (ref 0.3–1.2)
Total Protein: 7.9 g/dL (ref 6.5–8.1)

## 2020-07-18 LAB — CBC WITH DIFFERENTIAL/PLATELET
Abs Immature Granulocytes: 0.08 10*3/uL — ABNORMAL HIGH (ref 0.00–0.07)
Basophils Absolute: 0 10*3/uL (ref 0.0–0.1)
Basophils Relative: 0 %
Eosinophils Absolute: 0 10*3/uL (ref 0.0–0.5)
Eosinophils Relative: 0 %
HCT: 41.6 % (ref 36.0–46.0)
Hemoglobin: 13.3 g/dL (ref 12.0–15.0)
Immature Granulocytes: 0 %
Lymphocytes Relative: 6 %
Lymphs Abs: 1.2 10*3/uL (ref 0.7–4.0)
MCH: 25.8 pg — ABNORMAL LOW (ref 26.0–34.0)
MCHC: 32 g/dL (ref 30.0–36.0)
MCV: 80.8 fL (ref 80.0–100.0)
Monocytes Absolute: 0.6 10*3/uL (ref 0.1–1.0)
Monocytes Relative: 3 %
Neutro Abs: 20.4 10*3/uL — ABNORMAL HIGH (ref 1.7–7.7)
Neutrophils Relative %: 91 %
Platelets: 416 10*3/uL — ABNORMAL HIGH (ref 150–400)
RBC: 5.15 MIL/uL — ABNORMAL HIGH (ref 3.87–5.11)
RDW: 15.8 % — ABNORMAL HIGH (ref 11.5–15.5)
WBC: 22.3 10*3/uL — ABNORMAL HIGH (ref 4.0–10.5)
nRBC: 0 % (ref 0.0–0.2)

## 2020-07-18 LAB — MAGNESIUM: Magnesium: 2.5 mg/dL — ABNORMAL HIGH (ref 1.7–2.4)

## 2020-07-18 LAB — HIV ANTIBODY (ROUTINE TESTING W REFLEX): HIV Screen 4th Generation wRfx: NONREACTIVE

## 2020-07-18 MED ORDER — MONTELUKAST SODIUM 10 MG PO TABS
10.0000 mg | ORAL_TABLET | Freq: Every day | ORAL | Status: DC
  Administered 2020-07-18: 10 mg via ORAL
  Filled 2020-07-18: qty 1

## 2020-07-18 MED ORDER — DEXTROSE-NACL 5-0.9 % IV SOLN: INTRAVENOUS | Status: DC

## 2020-07-18 MED ORDER — IPRATROPIUM-ALBUTEROL 0.5-2.5 (3) MG/3ML IN SOLN
3.0000 mL | Freq: Four times a day (QID) | RESPIRATORY_TRACT | Status: DC
  Administered 2020-07-18 – 2020-07-19 (×4): 3 mL via RESPIRATORY_TRACT
  Filled 2020-07-18 (×4): qty 3

## 2020-07-18 MED ORDER — METHYLPREDNISOLONE SODIUM SUCC 40 MG IJ SOLR
40.0000 mg | Freq: Three times a day (TID) | INTRAMUSCULAR | Status: DC
  Administered 2020-07-18 – 2020-07-19 (×2): 40 mg via INTRAVENOUS
  Filled 2020-07-18 (×2): qty 1

## 2020-07-18 NOTE — Progress Notes (Signed)
Occupational Therapy Evaluation  Patient at baseline with self care tasks, no physical assistance needed. Note patient desat to 87% on room air and HR up to low 140s with ambulation from bathroom. No acute OT needs identified will discontinue order. Please re-consult if new needs arise.    07/18/20 0914  OT Visit Information  Last OT Received On 07/18/20  Assistance Needed +1  History of Present Illness Brenda Acevedo is a 24 y.o. 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  Pain Assessment  Pain Assessment No/denies pain  Cognition  Arousal/Alertness Awake/alert  Behavior During Therapy WFL for tasks assessed/performed  Overall Cognitive Status Within Functional Limits for tasks assessed  Upper Extremity Assessment  Upper Extremity Assessment Overall WFL for tasks assessed  Lower Extremity Assessment  Lower Extremity Assessment Defer to PT evaluation  ADL  Overall ADL's  Independent  General ADL Comments patient able to perform LB dressing, ambulate to bathroom and perform all peri care, clothing management, grooming/hygiene without assistance   Bed Mobility  Overal bed mobility Independent  Transfers  Overall transfer level Independent  Balance  Overall balance assessment Independent  General Comments  General comments (skin integrity, edema, etc.) patient desat to 87% with HR up to 140s on room  air with ambulation from bathroom back to her room  OT - End of Session  Equipment Utilized During Treatment Oxygen  Activity Tolerance Patient tolerated treatment well  Patient left in bed;with call bell/phone within reach  Nurse Communication Mobility status  OT Assessment  OT Recommendation/Assessment Patient does not need any further OT services  OT Visit Diagnosis Other abnormalities of gait and mobility (R26.89)  OT Problem List Cardiopulmonary status limiting activity;Decreased activity tolerance  AM-PAC OT "6 Clicks" Daily Activity Outcome Measure (Version 2)  Help from another person eating meals? 4  Help from another person taking care of personal grooming? 4  Help from another person toileting, which includes using toliet, bedpan, or urinal? 4  Help from another person bathing (including washing, rinsing, drying)? 4  Help from another person to put on and taking off regular upper body clothing? 4  Help from another person to put on and taking off regular lower body clothing? 4  6 Click Score 24  OT Recommendation  Follow Up Recommendations No OT follow up  OT Equipment None recommended by OT  Acute Rehab OT Goals  Patient Stated Goal to breathe better  OT Goal Formulation All assessment and education complete, DC therapy  OT Time Calculation  OT Start Time (ACUTE ONLY) 0757  OT Stop Time (ACUTE ONLY) 0815  OT Time Calculation (min) 18 min  OT General Charges  $OT Visit 1 Visit  OT Evaluation  $OT Eval Low Complexity 1 Low  Written Expression  Dominant Hand  (did not specify)   Marlyce Huge OT OT pager: [phone]

## 2020-07-18 NOTE — Evaluation (Signed)
Physical Therapy Evaluation Patient Details Name: Brenda Acevedo MRN: [id number] DOB: 1996-05-19 Today's Date: 07/18/2020   History of Present Illness  Brenda Acevedo is a 24 y.o. female with medical history significant of asthma  Clinical Impression  The patient is independent in mobility/ambulation. Patient ambulated x 200' on 2 L, SPO2 94-96%. HR up to 142. Patient ambulated x 100' on RA with SPO2 drop to 87%, 3/4 dyspnea. No further PT indicated. Nursing can perform saturation walk test prior to DC.     Follow Up Recommendations No PT follow up    Equipment Recommendations  None recommended by PT    Recommendations for Other Services       Precautions / Restrictions Precautions Precaution Comments: Monitor sats      Mobility  Bed Mobility Overal bed mobility: Independent                  Transfers Overall transfer level: Independent                  Ambulation/Gait Ambulation/Gait assistance: Independent Gait Distance (Feet): 200 Feet Assistive device: None Gait Pattern/deviations: WFL(Within Functional Limits) Gait velocity: slightly slower with DOE      Information systems manager Rankin (Stroke Patients Only)       Balance Overall balance assessment: Independent                                           Pertinent Vitals/Pain Pain Assessment: No/denies pain    Home Living Family/patient expects to be discharged to:: Private residence Living Arrangements: Spouse/significant other Available Help at Discharge: Family Type of Home: Apartment Home Access: Stairs to enter   Secretary/administrator of Steps: 15 Home Layout: One level Home Equipment: None      Prior Function Level of Independence: Independent               Hand Dominance        Extremity/Trunk Assessment   Upper Extremity Assessment Upper Extremity Assessment: Overall WFL for tasks assessed    Lower  Extremity Assessment Lower Extremity Assessment: Overall WFL for tasks assessed    Cervical / Trunk Assessment Cervical / Trunk Assessment: Normal  Communication   Communication: No difficulties  Cognition Arousal/Alertness: Awake/alert Behavior During Therapy: WFL for tasks assessed/performed Overall Cognitive Status: Within Functional Limits for tasks assessed                                        General Comments      Exercises     Assessment/Plan    PT Assessment Patent does not need any further PT services  PT Problem List         PT Treatment Interventions      PT Goals (Current goals can be found in the Care Plan section)  Acute Rehab PT Goals Patient Stated Goal: to breathe better PT Goal Formulation: All assessment and education complete, DC therapy    Frequency     Barriers to discharge        Co-evaluation               AM-PAC PT "6 Clicks" Mobility  Outcome Measure Help needed turning from your back  to your side while in a flat bed without using bedrails?: None Help needed moving from lying on your back to sitting on the side of a flat bed without using bedrails?: None Help needed moving to and from a bed to a chair (including a wheelchair)?: None Help needed standing up from a chair using your arms (e.g., wheelchair or bedside chair)?: None Help needed to walk in hospital room?: None Help needed climbing 3-5 steps with a railing? : A Little 6 Click Score: 23    End of Session Equipment Utilized During Treatment: Oxygen Activity Tolerance: Patient tolerated treatment well Patient left: in bed;with call bell/phone within reach   PT Visit Diagnosis: Difficulty in walking, not elsewhere classified (R26.2)    Time: 0932-6712 PT Time Calculation (min) (ACUTE ONLY): 30 min   Charges:   PT Evaluation $PT Eval Low Complexity: 1 Low          Blanchard Kelch PT Acute Rehabilitation Services Pager [phone] Office  [phone]   Rada Hay 07/18/2020, 9:18 AM

## 2020-07-18 NOTE — ED Notes (Signed)
Called report to Selena Batten, Charity fundraiser on 6E

## 2020-07-18 NOTE — Progress Notes (Signed)
Patient ID: Brenda Acevedo, female   DOB: 17-Jan-1996, 24 y.o.   MRN: [id number]  PROGRESS NOTE    Brenda Acevedo  OBS:[id number] DOB: 1996/05/28 DOA: 07/17/2020 PCP: Patient, No Pcp Per   Brief Narrative:  24 year old female with history of asthma presented with worsening shortness of breath and wheezing despite taking prednisone inhalers at home.  On presentation, she was tachycardic up to 140s and wheezing and desaturated with ambulation. Covid and flu tests were negative.  Chest x-ray showed mild airway thickening and suggested acute on chronic reactive airway disease.  She was started on IV Solu-Medrol.  Assessment & Plan:   Acute asthma exacerbation/status asthmaticus Hypoxia -Covid and flu tests were negative.  Chest x-ray showed mild airway thickening and suggested acute on chronic reactive airway disease -Still wheezing significantly.  Switch Solu-Medrol to 40 mg IV every 8 hours.  Continue Dulera.  Will add Singulair.  Continue as needed Xopenex. -Currently on 2 L oxygen via nasal cannula: Wean off as able  Leukocytosis -Probably reactive.  Improving.   DVT prophylaxis: Lovenox Code Status: Full Family Communication: None at bedside Disposition Plan: Status is: Inpatient  Remains inpatient appropriate because:Inpatient level of care appropriate due to severity of illness.  Probable discharge in 1 to 2 days if respiratory status improves   Dispo: The patient is from: Home              Anticipated d/c is to: Home              Anticipated d/c date is: 1 day              Patient currently is not medically stable to d/c.    Consultants: None  Procedures: None  Antimicrobials: None   Subjective: Patient seen and examined at bedside.  Feels slightly better than yesterday but still short of breath and still wheezing.  Denies chest pain, fever, nausea or vomiting.  Continues to have intermittent cough.  Objective: Vitals:   07/18/20 0620 07/18/20 0800 07/18/20 0900  07/18/20 0940  BP: 113/68 124/70 129/79 124/71  Pulse: (!) 114 99 (!) 106 (!) 107  Resp: (!) 22 (!) 23 20 20   Temp: 97.9 F (36.6 C)   97.6 F (36.4 C)  TempSrc: Oral   Oral  SpO2: 95% 96% 100% 96%  Weight:    78 kg  Height:    5\' 2"  (1.575 m)    Intake/Output Summary (Last 24 hours) at 07/18/2020 1029 Last data filed at 07/17/2020 2021 Gross per 24 hour  Intake 50 ml  Output --  Net 50 ml   Filed Weights   07/17/20 1757 07/18/20 0940  Weight: 72.6 kg 78 kg    Examination:  General exam: Appears calm and comfortable.  Currently on 2 L oxygen via nasal cannula Respiratory system: Bilateral decreased breath sounds at bases with diffuse wheezing.  Intermittently tachypneic Cardiovascular system: S1 & S2 heard, Rate controlled Gastrointestinal system: Abdomen is nondistended, soft and nontender. Normal bowel sounds heard. Extremities: No cyanosis, clubbing, edema  Central nervous system: Alert and oriented. No focal neurological deficits. Moving extremities Skin: No rashes, lesions or ulcers Psychiatry: Judgement and insight appear normal. Mood & affect appropriate.     Data Reviewed: I have personally reviewed following labs and imaging studies  CBC: Recent Labs  Lab 07/17/20 1920 07/18/20 0839  WBC 24.8* 22.3*  NEUTROABS 23.3* 20.4*  HGB 13.4 13.3  HCT 41.6 41.6  MCV 80.6 80.8  PLT 410* 416*   Basic  Metabolic Panel: Recent Labs  Lab 07/17/20 1920 07/18/20 0839  NA 135 138  K 3.4* 4.5  CL 100 105  CO2 20* 23  GLUCOSE 153* 127*  BUN 13 14  CREATININE 1.08* 0.85  CALCIUM 9.6 9.3  MG  --  2.5*   GFR: Estimated Creatinine Clearance: 97.9 mL/min (by C-G formula based on SCr of 0.85 mg/dL). Liver Function Tests: Recent Labs  Lab 07/18/20 0839  AST 24  ALT 30  ALKPHOS 85  BILITOT 0.6  PROT 7.9  ALBUMIN 4.3   No results for input(s): LIPASE, AMYLASE in the last 168 hours. No results for input(s): AMMONIA in the last 168 hours. Coagulation  Profile: No results for input(s): INR, PROTIME in the last 168 hours. Cardiac Enzymes: No results for input(s): CKTOTAL, CKMB, CKMBINDEX, TROPONINI in the last 168 hours. BNP (last 3 results) No results for input(s): PROBNP in the last 8760 hours. HbA1C: No results for input(s): HGBA1C in the last 72 hours. CBG: No results for input(s): GLUCAP in the last 168 hours. Lipid Profile: No results for input(s): CHOL, HDL, LDLCALC, TRIG, CHOLHDL, LDLDIRECT in the last 72 hours. Thyroid Function Tests: No results for input(s): TSH, T4TOTAL, FREET4, T3FREE, THYROIDAB in the last 72 hours. Anemia Panel: No results for input(s): VITAMINB12, FOLATE, FERRITIN, TIBC, IRON, RETICCTPCT in the last 72 hours. Sepsis Labs: No results for input(s): PROCALCITON, LATICACIDVEN in the last 168 hours.  Recent Results (from the past 240 hour(s))  Resp Panel by RT-PCR (Flu A&B, Covid) Nasopharyngeal Swab     Status: None   Collection Time: 07/17/20  7:20 PM   Specimen: Nasopharyngeal Swab; Nasopharyngeal(NP) swabs in vial transport medium  Result Value Ref Range Status   SARS Coronavirus 2 by RT PCR NEGATIVE NEGATIVE Final    Comment: (NOTE) SARS-CoV-2 target nucleic acids are NOT DETECTED.  The SARS-CoV-2 RNA is generally detectable in upper respiratory specimens during the acute phase of infection. The lowest concentration of SARS-CoV-2 viral copies this assay can detect is 138 copies/mL. A negative result does not preclude SARS-Cov-2 infection and should not be used as the sole basis for treatment or other patient management decisions. A negative result may occur with  improper specimen collection/handling, submission of specimen other than nasopharyngeal swab, presence of viral mutation(s) within the areas targeted by this assay, and inadequate number of viral copies(<138 copies/mL). A negative result must be combined with clinical observations, patient history, and epidemiological information. The  expected result is Negative.  Fact Sheet for Patients:  BloggerCourse.com  Fact Sheet for Healthcare Providers:  SeriousBroker.it  This test is no t yet approved or cleared by the [place] FDA and  has been authorized for detection and/or diagnosis of SARS-CoV-2 by FDA under an Emergency Use Authorization (EUA). This EUA will remain  in effect (meaning this test can be used) for the duration of the COVID-19 declaration under Section 564(b)(1) of the Act, 21 U.S.C.section 360bbb-3(b)(1), unless the authorization is terminated  or revoked sooner.       Influenza A by PCR NEGATIVE NEGATIVE Final   Influenza B by PCR NEGATIVE NEGATIVE Final    Comment: (NOTE) The Xpert Xpress SARS-CoV-2/FLU/RSV plus assay is intended as an aid in the diagnosis of influenza from Nasopharyngeal swab specimens and should not be used as a sole basis for treatment. Nasal washings and aspirates are unacceptable for Xpert Xpress SARS-CoV-2/FLU/RSV testing.  Fact Sheet for Patients: BloggerCourse.com  Fact Sheet for Healthcare Providers: SeriousBroker.it  This test is not  yet approved or cleared by the [place] and has been authorized for detection and/or diagnosis of SARS-CoV-2 by FDA under an Emergency Use Authorization (EUA). This EUA will remain in effect (meaning this test can be used) for the duration of the COVID-19 declaration under Section 564(b)(1) of the Act, 21 U.S.C. section 360bbb-3(b)(1), unless the authorization is terminated or revoked.  Performed at [facility], [address], [place], [place] [address]          Radiology Studies: DG Chest 2 View  Result Date: 07/17/2020 CLINICAL DATA:  Shortness of breath, COVID-19 negative EXAM: CHEST - 2 VIEW COMPARISON:  Radiograph 04/26/2020 FINDINGS: Mild airways thickening. No consolidation, features of  edema, pneumothorax, or effusion. The cardiomediastinal contours are unremarkable. Telemetry leads overlie the chest. No acute osseous or soft tissue abnormality. IMPRESSION: Mild airways thickening, can be seen in the setting of acute or chronic reactive airways disease. Electronically Signed   By: Kreg Shropshire M.D.   On: 07/17/2020 18:51        Scheduled Meds: . enoxaparin (LOVENOX) injection  40 mg Subcutaneous Q24H  . methylPREDNISolone (SOLU-MEDROL) injection  60 mg Intravenous Q12H  . mometasone-formoterol  2 puff Inhalation BID   Continuous Infusions:        Glade Lloyd, MD [facility] 07/18/2020, 10:29 AM

## 2020-07-19 DIAGNOSIS — J45902 Unspecified asthma with status asthmaticus: Secondary | ICD-10-CM | POA: Diagnosis not present

## 2020-07-19 DIAGNOSIS — D72829 Elevated white blood cell count, unspecified: Secondary | ICD-10-CM | POA: Diagnosis not present

## 2020-07-19 LAB — CBC WITH DIFFERENTIAL/PLATELET
Abs Immature Granulocytes: 0.14 10*3/uL — ABNORMAL HIGH (ref 0.00–0.07)
Basophils Absolute: 0 10*3/uL (ref 0.0–0.1)
Basophils Relative: 0 %
Eosinophils Absolute: 0 10*3/uL (ref 0.0–0.5)
Eosinophils Relative: 0 %
HCT: 42.9 % (ref 36.0–46.0)
Hemoglobin: 13.5 g/dL (ref 12.0–15.0)
Immature Granulocytes: 1 %
Lymphocytes Relative: 8 %
Lymphs Abs: 1.5 10*3/uL (ref 0.7–4.0)
MCH: 25.9 pg — ABNORMAL LOW (ref 26.0–34.0)
MCHC: 31.5 g/dL (ref 30.0–36.0)
MCV: 82.2 fL (ref 80.0–100.0)
Monocytes Absolute: 0.4 10*3/uL (ref 0.1–1.0)
Monocytes Relative: 2 %
Neutro Abs: 16.8 10*3/uL — ABNORMAL HIGH (ref 1.7–7.7)
Neutrophils Relative %: 89 %
Platelets: 400 10*3/uL (ref 150–400)
RBC: 5.22 MIL/uL — ABNORMAL HIGH (ref 3.87–5.11)
RDW: 15.9 % — ABNORMAL HIGH (ref 11.5–15.5)
WBC: 18.8 10*3/uL — ABNORMAL HIGH (ref 4.0–10.5)
nRBC: 0 % (ref 0.0–0.2)

## 2020-07-19 LAB — BASIC METABOLIC PANEL
Anion gap: 10 (ref 5–15)
BUN: 18 mg/dL (ref 6–20)
CO2: 23 mmol/L (ref 22–32)
Calcium: 9 mg/dL (ref 8.9–10.3)
Chloride: 103 mmol/L (ref 98–111)
Creatinine, Ser: 0.85 mg/dL (ref 0.44–1.00)
GFR, Estimated: >60 mL/min (ref >60)
Glucose, Bld: 129 mg/dL — ABNORMAL HIGH (ref 70–99)
Potassium: 4.5 mmol/L (ref 3.5–5.1)
Sodium: 136 mmol/L (ref 135–145)

## 2020-07-19 LAB — MAGNESIUM: Magnesium: 2.3 mg/dL (ref 1.7–2.4)

## 2020-07-19 MED ORDER — MONTELUKAST SODIUM 10 MG PO TABS
10.0000 mg | ORAL_TABLET | Freq: Every day | ORAL | 0 refills | Status: DC
Start: 2020-07-19 — End: 2021-10-21

## 2020-07-19 MED ORDER — PREDNISONE 20 MG PO TABS: 40.0000 mg | ORAL_TABLET | Freq: Every day | ORAL | 0 refills | Status: AC

## 2020-07-19 MED ORDER — MOMETASONE FURO-FORMOTEROL FUM 200-5 MCG/ACT IN AERO
2.0000 | INHALATION_SPRAY | Freq: Two times a day (BID) | RESPIRATORY_TRACT | 0 refills | Status: DC
Start: 2020-07-19 — End: 2021-08-16

## 2020-07-19 NOTE — Discharge Summary (Signed)
Physician Discharge Summary  Brenda Acevedo WUJ:[id number] DOB: 09/26/1995 DOA: 07/17/2020  PCP: Patient, No Pcp Per  Admit date: 07/17/2020 Discharge date: 07/19/2020  Admitted From: Home Disposition: Home  Recommendations for Outpatient Follow-up:  1. Follow up with PCP in 1 week 2. Recommend outpatient evaluation and follow-up by pulmonary 3. Follow up in ED if symptoms worsen or new appear   Home Health: No Equipment/Devices: None  Discharge Condition: Stable CODE STATUS: Full Diet recommendation: Regular  Brief/Interim Summary: 24 year old female with history of asthma presented with worsening shortness of breath and wheezing despite taking prednisone inhalers at home.  On presentation, she was tachycardic up to 140s and wheezing and desaturated with ambulation. Covid and flu tests were negative.  Chest x-ray showed mild airway thickening and suggested acute on chronic reactive airway disease.  She was started on IV Solu-Medrol.  During the hospitalization, her condition has improved.  Currently she is on room air.  She feels much better and wants to go home.  She will be discharged today on oral prednisone, Singulair along with inhaled Dulera.   Discharge Diagnoses:   Acute asthma exacerbation/status asthmaticus Hypoxia -Covid and flu tests were negative.  Chest x-ray showed mild airway thickening and suggested acute on chronic reactive airway disease -Treated with IV Solu-Medrol along with nebs and also started on Singulair.  Respiratory status has much improved.  Currently on room air.  Feels much better and wants to go home.  Will discharge today on prednisone 40 mg daily for 7 days.  Continue Dulera on discharge and as needed albuterol inhaler.  Continue Singulair as well. -Recommend outpatient evaluation and follow-up by pulmonary.   Leukocytosis -Probably reactive.  Improving.  Discharge Instructions  Discharge Instructions    Ambulatory referral to Pulmonology    Complete by: As directed    Reason for referral: Asthma/COPD   Diet general   Complete by: As directed    Increase activity slowly   Complete by: As directed      Allergies as of 07/19/2020      Reactions   Coconut (cocos Nucifera) Allergy Skin Test Other (See Comments)   Justicia Adhatoda (malabar Nut Tree) [justicia Adhatoda] Other (See Comments)   Tree nuts   Shellfish Allergy Other (See Comments)      Medication List    STOP taking these medications   azithromycin 250 MG tablet Commonly known as: ZITHROMAX   dexamethasone 4 MG tablet Commonly known as: DECADRON     TAKE these medications   mometasone-formoterol 200-5 MCG/ACT Aero Commonly known as: DULERA Inhale 2 puffs into the lungs 2 (two) times daily.   montelukast 10 MG tablet Commonly known as: SINGULAIR Take 1 tablet (10 mg total) by mouth at bedtime.   predniSONE 20 MG tablet Commonly known as: Deltasone Take 2 tablets (40 mg total) by mouth daily for 7 days.   ProAir HFA 108 (90 Base) MCG/ACT inhaler Generic drug: albuterol Inhale 2 puffs into the lungs every 6 (six) hours as needed for wheezing.       Allergies  Allergen Reactions  . Coconut (Cocos Nucifera) Allergy Skin Test Other (See Comments)  . Justicia Adhatoda (Malabar Nut Tree) [Justicia Adhatoda] Other (See Comments)    Tree nuts  . Shellfish Allergy Other (See Comments)    Consultations:  None   Procedures/Studies: DG Chest 2 View  Result Date: 07/17/2020 CLINICAL DATA:  Shortness of breath, COVID-19 negative EXAM: CHEST - 2 VIEW COMPARISON:  Radiograph 04/26/2020 FINDINGS: Mild airways thickening.  No consolidation, features of edema, pneumothorax, or effusion. The cardiomediastinal contours are unremarkable. Telemetry leads overlie the chest. No acute osseous or soft tissue abnormality. IMPRESSION: Mild airways thickening, can be seen in the setting of acute or chronic reactive airways disease. Electronically Signed   By: Kreg Shropshire M.D.   On: 07/17/2020 18:51       Subjective: Patient seen and examined at bedside.  She feels much better and wants to go home today.  Denies worsening cough or shortness of breath.  Feels that her wheezing has much improved.  Discharge Exam: Vitals:   07/19/20 0801 07/19/20 0804  BP:    Pulse:    Resp:    Temp:    SpO2: 96% 96%    General: Pt is alert, awake, not in acute distress Cardiovascular: rate controlled, S1/S2 + Respiratory: bilateral decreased breath sounds at bases with very minimal wheezing Abdominal: Soft, NT, ND, bowel sounds + Extremities: no edema, no cyanosis    The results of significant diagnostics from this hospitalization (including imaging, microbiology, ancillary and laboratory) are listed below for reference.     Microbiology: Recent Results (from the past 240 hour(s))  Resp Panel by RT-PCR (Flu A&B, Covid) Nasopharyngeal Swab     Status: None   Collection Time: 07/17/20  7:20 PM   Specimen: Nasopharyngeal Swab; Nasopharyngeal(NP) swabs in vial transport medium  Result Value Ref Range Status   SARS Coronavirus 2 by RT PCR NEGATIVE NEGATIVE Final    Comment: (NOTE) SARS-CoV-2 target nucleic acids are NOT DETECTED.  The SARS-CoV-2 RNA is generally detectable in upper respiratory specimens during the acute phase of infection. The lowest concentration of SARS-CoV-2 viral copies this assay can detect is 138 copies/mL. A negative result does not preclude SARS-Cov-2 infection and should not be used as the sole basis for treatment or other patient management decisions. A negative result may occur with  improper specimen collection/handling, submission of specimen other than nasopharyngeal swab, presence of viral mutation(s) within the areas targeted by this assay, and inadequate number of viral copies(<138 copies/mL). A negative result must be combined with clinical observations, patient history, and epidemiological information. The expected  result is Negative.  Fact Sheet for Patients:  BloggerCourse.com  Fact Sheet for Healthcare Providers:  SeriousBroker.it  This test is no t yet approved or cleared by the [place] FDA and  has been authorized for detection and/or diagnosis of SARS-CoV-2 by FDA under an Emergency Use Authorization (EUA). This EUA will remain  in effect (meaning this test can be used) for the duration of the COVID-19 declaration under Section 564(b)(1) of the Act, 21 U.S.C.section 360bbb-3(b)(1), unless the authorization is terminated  or revoked sooner.       Influenza A by PCR NEGATIVE NEGATIVE Final   Influenza B by PCR NEGATIVE NEGATIVE Final    Comment: (NOTE) The Xpert Xpress SARS-CoV-2/FLU/RSV plus assay is intended as an aid in the diagnosis of influenza from Nasopharyngeal swab specimens and should not be used as a sole basis for treatment. Nasal washings and aspirates are unacceptable for Xpert Xpress SARS-CoV-2/FLU/RSV testing.  Fact Sheet for Patients: BloggerCourse.com  Fact Sheet for Healthcare Providers: SeriousBroker.it  This test is not yet approved or cleared by the [place] FDA and has been authorized for detection and/or diagnosis of SARS-CoV-2 by FDA under an Emergency Use Authorization (EUA). This EUA will remain in effect (meaning this test can be used) for the duration of the COVID-19 declaration under Section 564(b)(1) of the  Act, 21 U.S.C. section 360bbb-3(b)(1), unless the authorization is terminated or revoked.  Performed at [facility], [address], [place], [place] [address]      Labs: BNP (last 3 results) No results for input(s): BNP in the last 8760 hours. Basic Metabolic Panel: Recent Labs  Lab 07/17/20 1920 07/18/20 0839 07/19/20 0603  NA 135 138 136  K 3.4* 4.5 4.5  CL 100 105 103  CO2 20* 23 23  GLUCOSE 153* 127*  129*  BUN 13 14 18   CREATININE 1.08* 0.85 0.85  CALCIUM 9.6 9.3 9.0  MG  --  2.5* 2.3   Liver Function Tests: Recent Labs  Lab 07/18/20 0839  AST 24  ALT 30  ALKPHOS 85  BILITOT 0.6  PROT 7.9  ALBUMIN 4.3   No results for input(s): LIPASE, AMYLASE in the last 168 hours. No results for input(s): AMMONIA in the last 168 hours. CBC: Recent Labs  Lab 07/17/20 1920 07/18/20 0839 07/19/20 0603  WBC 24.8* 22.3* 18.8*  NEUTROABS 23.3* 20.4* 16.8*  HGB 13.4 13.3 13.5  HCT 41.6 41.6 42.9  MCV 80.6 80.8 82.2  PLT 410* 416* 400   Cardiac Enzymes: No results for input(s): CKTOTAL, CKMB, CKMBINDEX, TROPONINI in the last 168 hours. BNP: Invalid input(s): POCBNP CBG: No results for input(s): GLUCAP in the last 168 hours. D-Dimer Recent Labs    07/17/20 2330  DDIMER 0.43   Hgb A1c No results for input(s): HGBA1C in the last 72 hours. Lipid Profile No results for input(s): CHOL, HDL, LDLCALC, TRIG, CHOLHDL, LDLDIRECT in the last 72 hours. Thyroid function studies No results for input(s): TSH, T4TOTAL, T3FREE, THYROIDAB in the last 72 hours.  Invalid input(s): FREET3 Anemia work up No results for input(s): VITAMINB12, FOLATE, FERRITIN, TIBC, IRON, RETICCTPCT in the last 72 hours. Urinalysis    Component Value Date/Time   COLORURINE YELLOW 08/27/2019 1722   APPEARANCEUR CLEAR 08/27/2019 1722   LABSPEC 1.019 08/27/2019 1722   PHURINE 6.0 08/27/2019 1722   GLUCOSEU NEGATIVE 08/27/2019 1722   HGBUR LARGE (A) 08/27/2019 1722   BILIRUBINUR NEGATIVE 08/27/2019 1722   KETONESUR NEGATIVE 08/27/2019 1722   PROTEINUR NEGATIVE 08/27/2019 1722   NITRITE NEGATIVE 08/27/2019 1722   LEUKOCYTESUR TRACE (A) 08/27/2019 1722   Sepsis Labs Invalid input(s): PROCALCITONIN,  WBC,  LACTICIDVEN Microbiology Recent Results (from the past 240 hour(s))  Resp Panel by RT-PCR (Flu A&B, Covid) Nasopharyngeal Swab     Status: None   Collection Time: 07/17/20  7:20 PM   Specimen:  Nasopharyngeal Swab; Nasopharyngeal(NP) swabs in vial transport medium  Result Value Ref Range Status   SARS Coronavirus 2 by RT PCR NEGATIVE NEGATIVE Final    Comment: (NOTE) SARS-CoV-2 target nucleic acids are NOT DETECTED.  The SARS-CoV-2 RNA is generally detectable in upper respiratory specimens during the acute phase of infection. The lowest concentration of SARS-CoV-2 viral copies this assay can detect is 138 copies/mL. A negative result does not preclude SARS-Cov-2 infection and should not be used as the sole basis for treatment or other patient management decisions. A negative result may occur with  improper specimen collection/handling, submission of specimen other than nasopharyngeal swab, presence of viral mutation(s) within the areas targeted by this assay, and inadequate number of viral copies(<138 copies/mL). A negative result must be combined with clinical observations, patient history, and epidemiological information. The expected result is Negative.  Fact Sheet for Patients:  14/01/21  Fact Sheet for Healthcare Providers:  BloggerCourse.com  This test is no  t yet approved or cleared by the [place] and  has been authorized for detection and/or diagnosis of SARS-CoV-2 by FDA under an Emergency Use Authorization (EUA). This EUA will remain  in effect (meaning this test can be used) for the duration of the COVID-19 declaration under Section 564(b)(1) of the Act, 21 U.S.C.section 360bbb-3(b)(1), unless the authorization is terminated  or revoked sooner.       Influenza A by PCR NEGATIVE NEGATIVE Final   Influenza B by PCR NEGATIVE NEGATIVE Final    Comment: (NOTE) The Xpert Xpress SARS-CoV-2/FLU/RSV plus assay is intended as an aid in the diagnosis of influenza from Nasopharyngeal swab specimens and should not be used as a sole basis for treatment. Nasal washings and aspirates are unacceptable for  Xpert Xpress SARS-CoV-2/FLU/RSV testing.  Fact Sheet for Patients: BloggerCourse.com  Fact Sheet for Healthcare Providers: SeriousBroker.it  This test is not yet approved or cleared by the [place] FDA and has been authorized for detection and/or diagnosis of SARS-CoV-2 by FDA under an Emergency Use Authorization (EUA). This EUA will remain in effect (meaning this test can be used) for the duration of the COVID-19 declaration under Section 564(b)(1) of the Act, 21 U.S.C. section 360bbb-3(b)(1), unless the authorization is terminated or revoked.  Performed at [facility], [address], [place], [place] [address]      Time coordinating discharge: 35 minutes  SIGNED:   Glade Lloyd, MD  [facility] 07/19/2020, 8:55 AM

## 2020-09-11 IMAGING — US US PELVIS COMPLETE
1 series · 13 of 25 positions shown · non-contrast
Comparison: None.

CLINICAL DATA: Pelvic pain.  Abnormal uterine bleeding.

EXAM:
TRANSABDOMINAL AND TRANSVAGINAL ULTRASOUND OF PELVIS
DOPPLER ULTRASOUND OF OVARIES
TECHNIQUE: Both transabdominal and transvaginal ultrasound examinations of the
pelvis were performed. Transabdominal technique was performed for
global imaging of the pelvis including uterus, ovaries, adnexal
regions, and pelvic cul-de-sac.
It was necessary to proceed with endovaginal exam following the
transabdominal exam to visualize the ovaries and endometrial
stripe. Color and duplex Doppler ultrasound was utilized to
evaluate blood flow to the ovaries.

[Series 1: us pelvis complete · 13 of 91 slices shown]
[im 1/91]
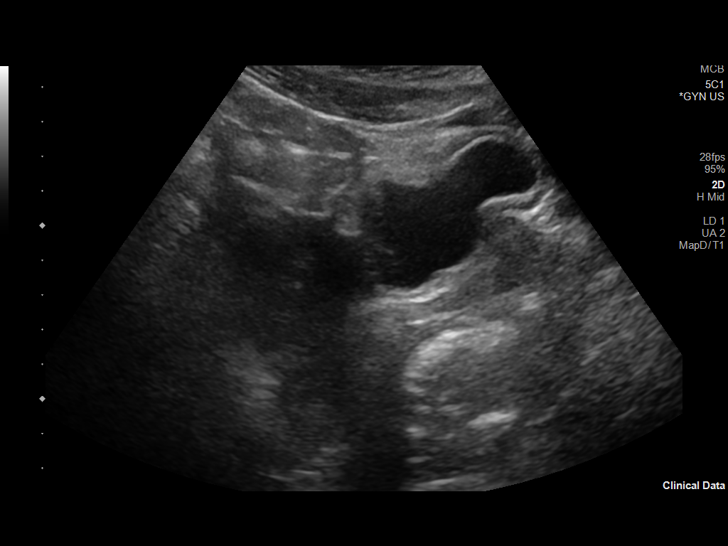
[im 8/91]
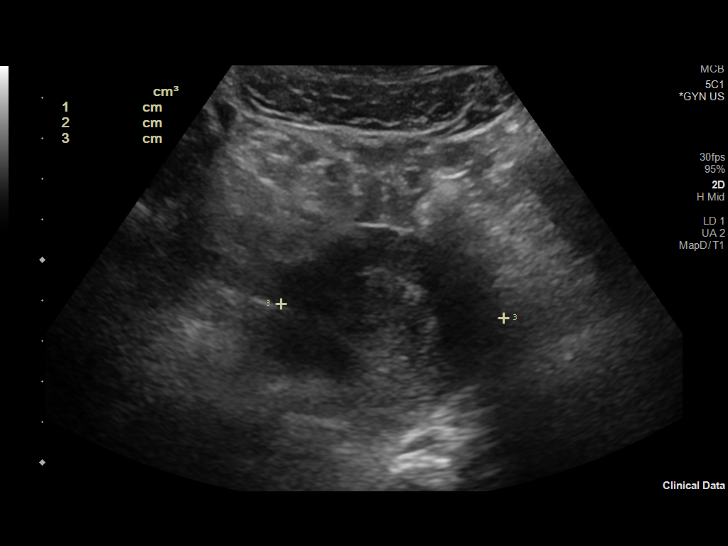
[im 16/91]
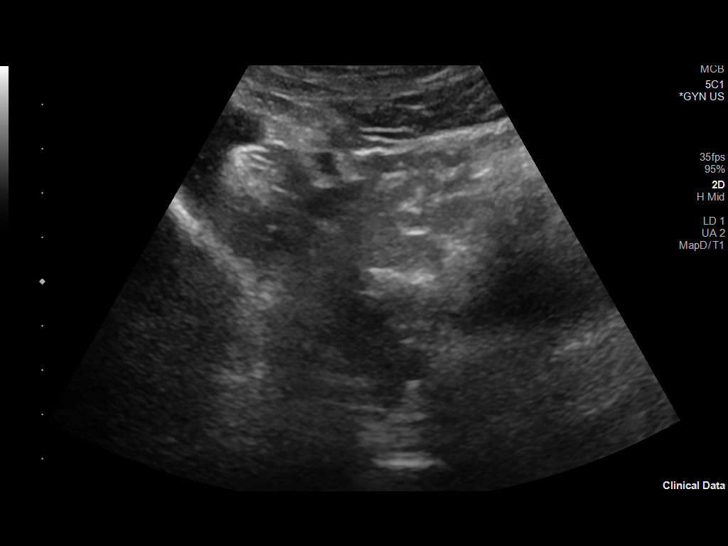
[im 23/91]
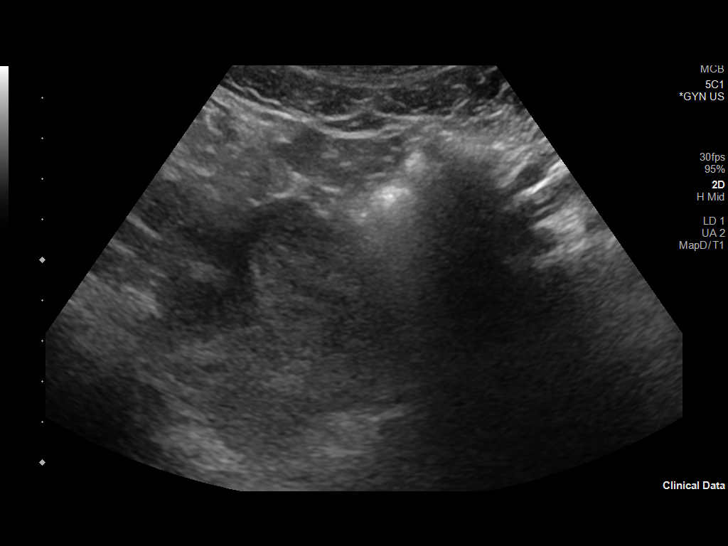
[im 31/91]
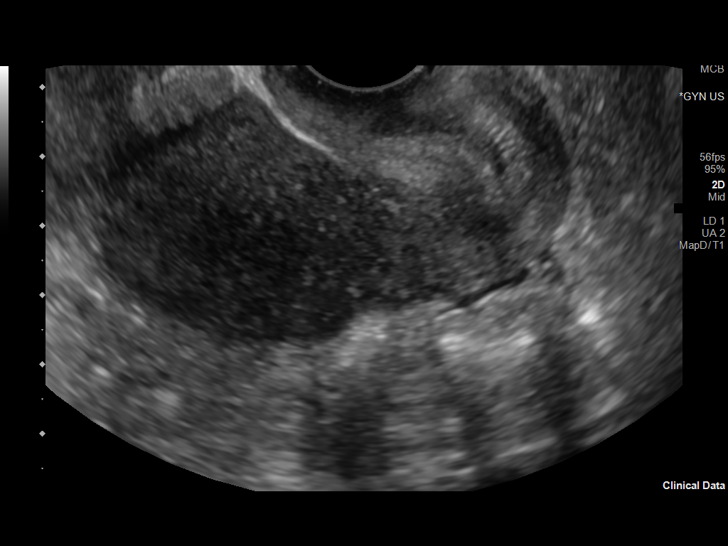
[im 38/91]
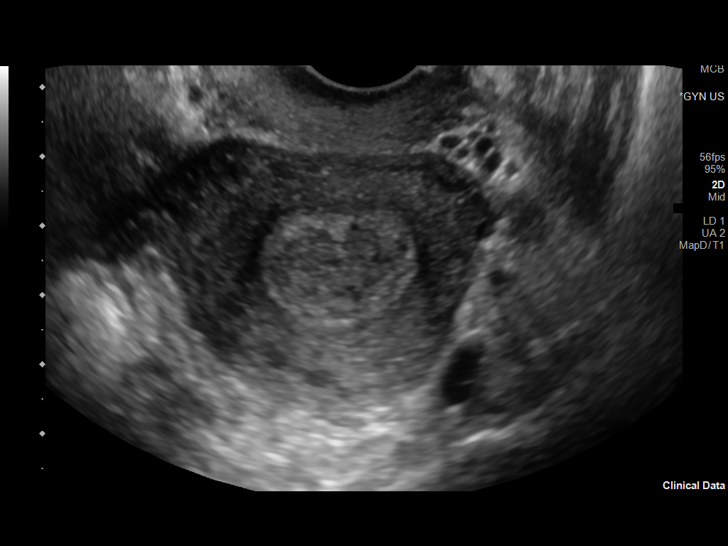
[im 46/91]
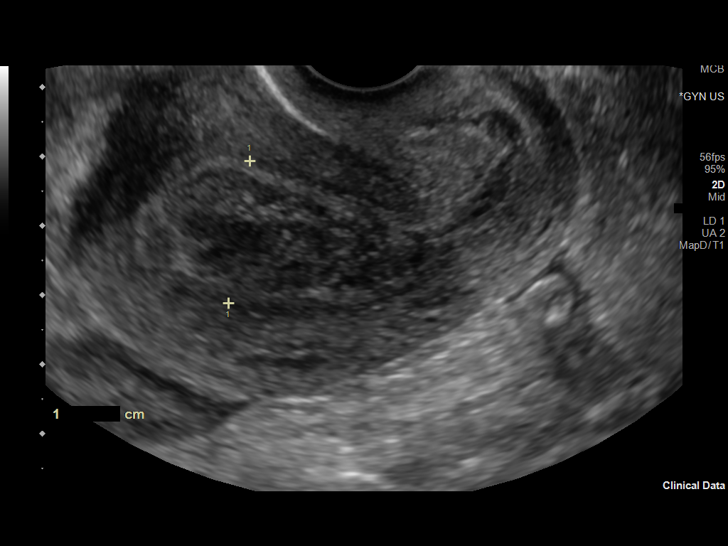
[im 53/91]
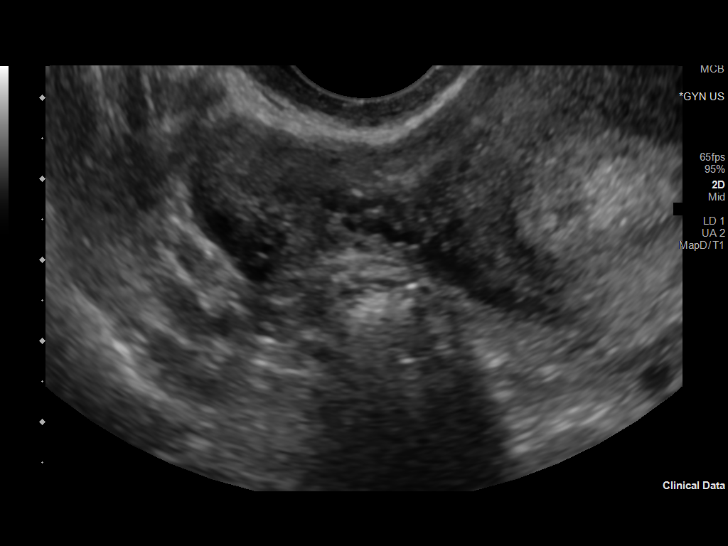
[im 61/91]
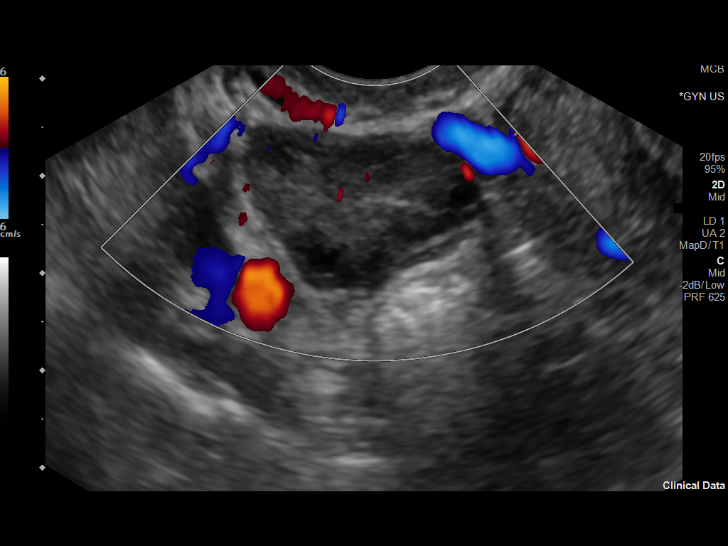
[im 68/91]
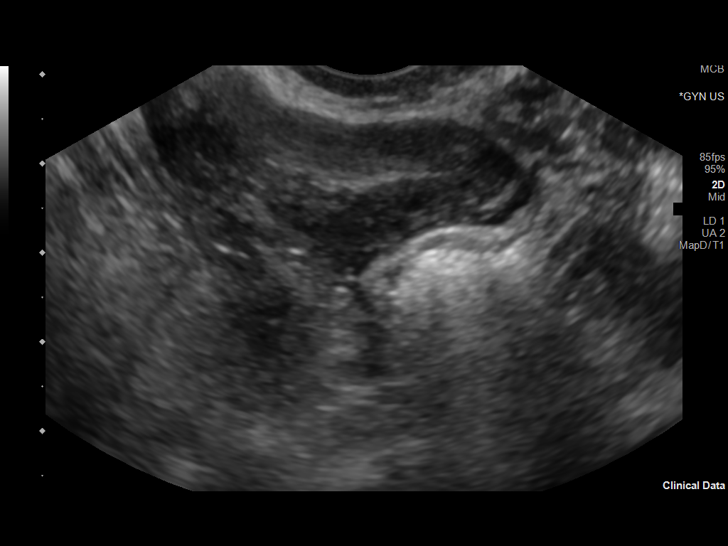
[im 76/91]
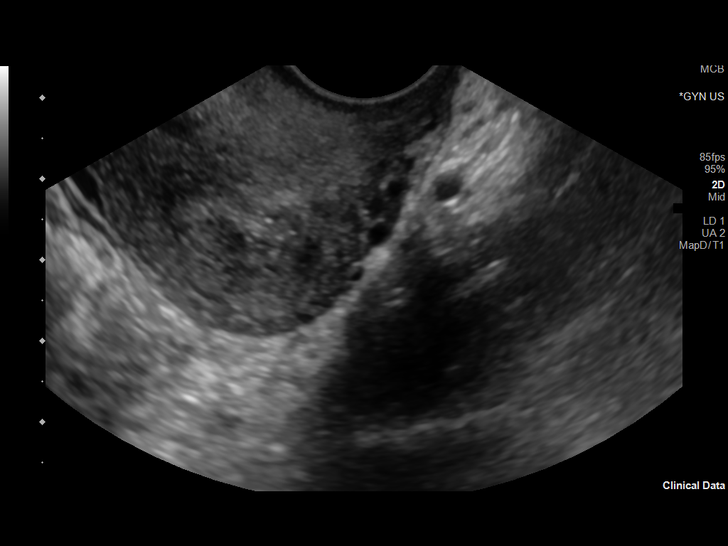
[im 83/91]
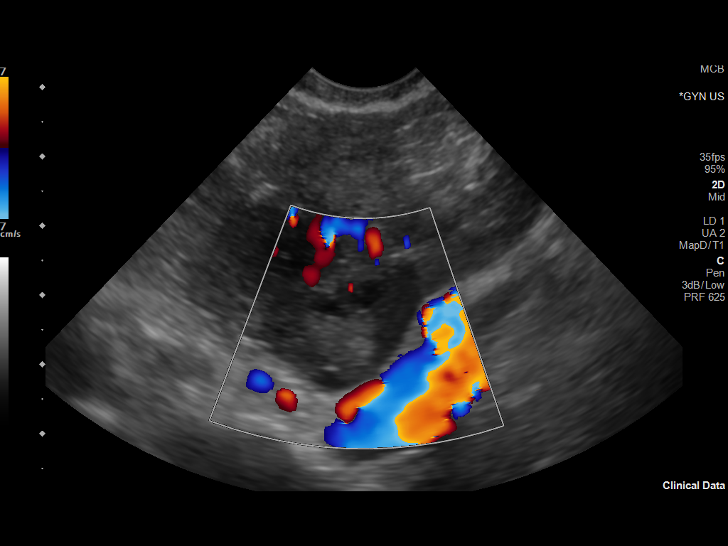
[im 91/91]
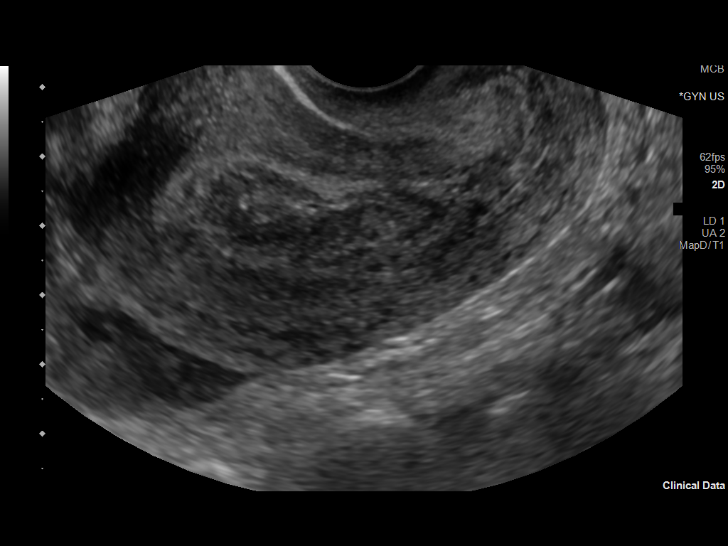

[13 of 25 positions shown; findings below may reference images not displayed]

FINDINGS: Uterus

Measurements: 7.4 x 4.7 x 0.4 cm = volume: 98 mL. No fibroids or
other mass visualized.

Endometrium

Thickness: 21 mm.  No focal abnormality visualized.

Right ovary

Measurements: 3.5 x 1.6 x 2.5 cm = volume: 7 point mL. Normal
appearance/no adnexal mass.

Left ovary

Measurements: 2.9 x 1.7 x 2.6 cm = volume: 6.7 mL. Normal
appearance/no adnexal mass.

Pulsed Doppler evaluation of both ovaries demonstrates normal
low-resistance arterial and venous waveforms.

Other findings

There is a small amount of free fluid about the right adnexa.
IMPRESSION: 1. No evidence for ovarian torsion.
2. Thickened endometrial stripe measuring approximately 2.1 cm.
Consider follow-up pelvic ultrasound in 6-8 weeks in the early
proliferative phase of the menstrual cycle after initiation of
hormonal or medical therapy. If the abnormal uterine bleeding is
already refractory to hormonal or medical therapy, consider
endometrial biopsy, as clinically warranted.

## 2020-09-11 IMAGING — US US ART/VEN ABD/PELV/SCROTUM DOPPLER LTD
1 series · 13 of 25 positions shown · non-contrast
Comparison: None.

CLINICAL DATA: Pelvic pain.  Abnormal uterine bleeding.

EXAM:
TRANSABDOMINAL AND TRANSVAGINAL ULTRASOUND OF PELVIS
DOPPLER ULTRASOUND OF OVARIES
TECHNIQUE: Both transabdominal and transvaginal ultrasound examinations of the
pelvis were performed. Transabdominal technique was performed for
global imaging of the pelvis including uterus, ovaries, adnexal
regions, and pelvic cul-de-sac.
It was necessary to proceed with endovaginal exam following the
transabdominal exam to visualize the ovaries and endometrial
stripe. Color and duplex Doppler ultrasound was utilized to
evaluate blood flow to the ovaries.

[Series 1: us art/ven abd/pelv/scrotum doppler ltd · 13 of 91 slices shown]
[im 1/91]
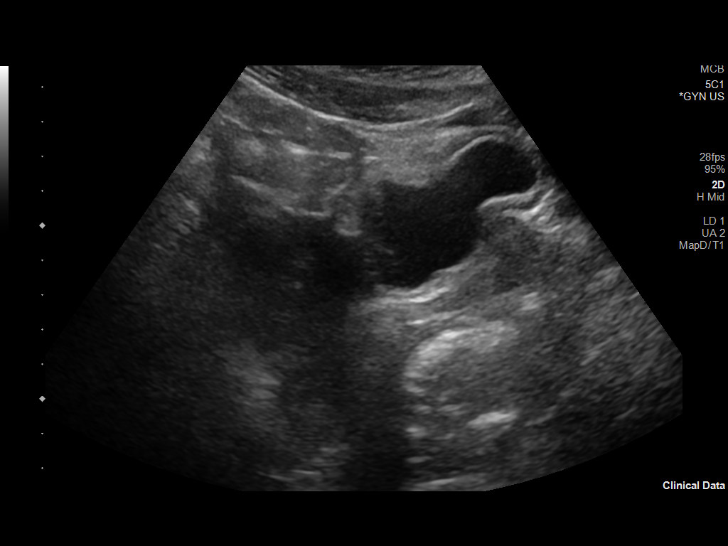
[im 8/91]
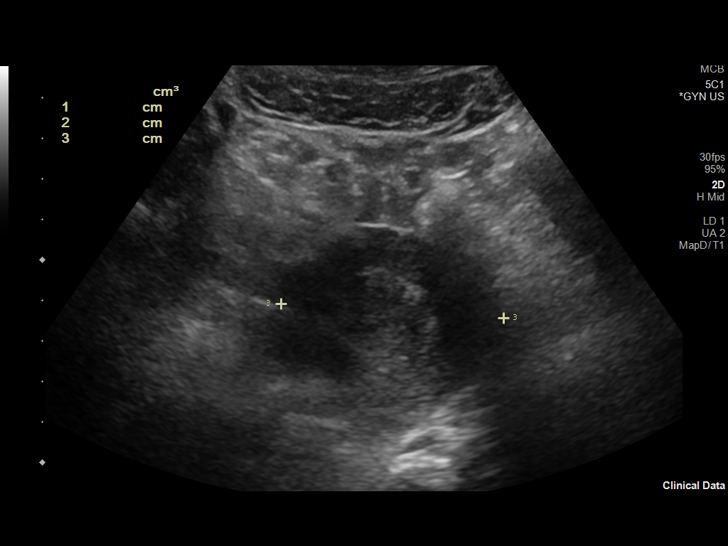
[im 16/91]
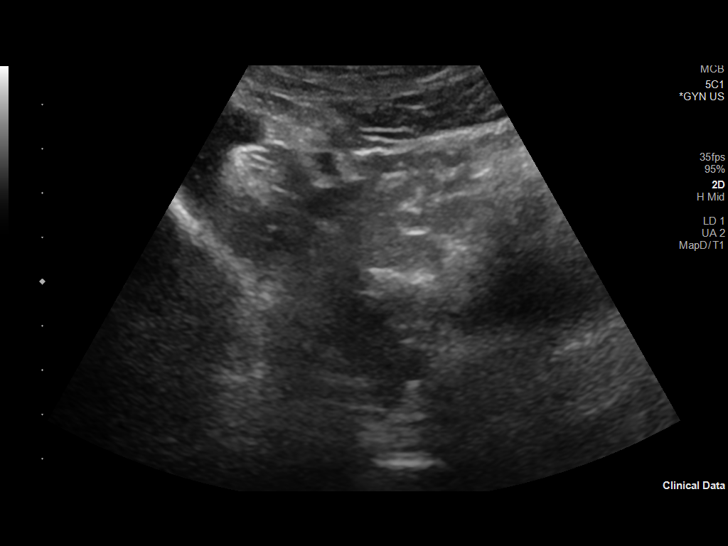
[im 23/91]
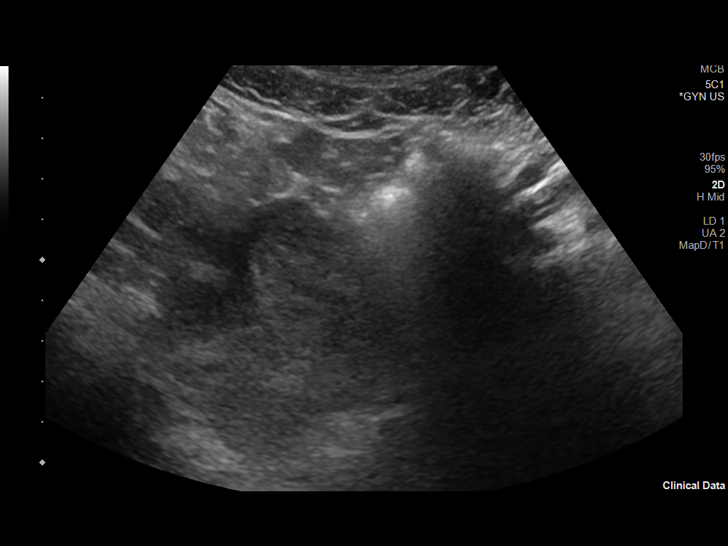
[im 31/91]
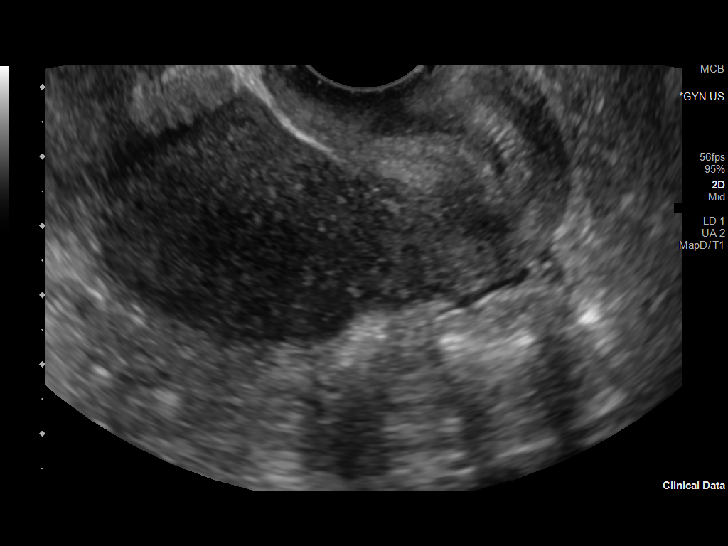
[im 38/91]
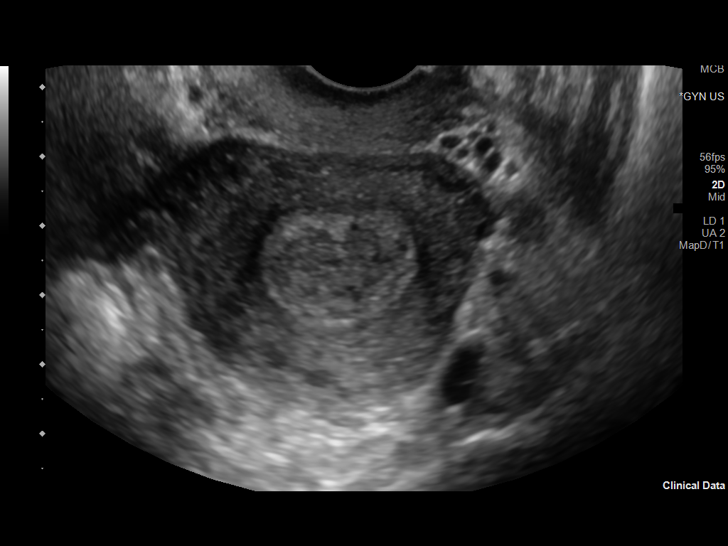
[im 46/91]
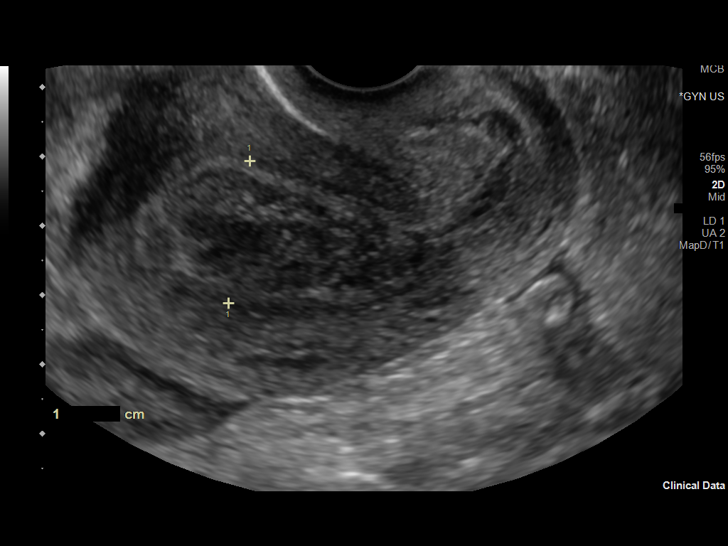
[im 53/91]
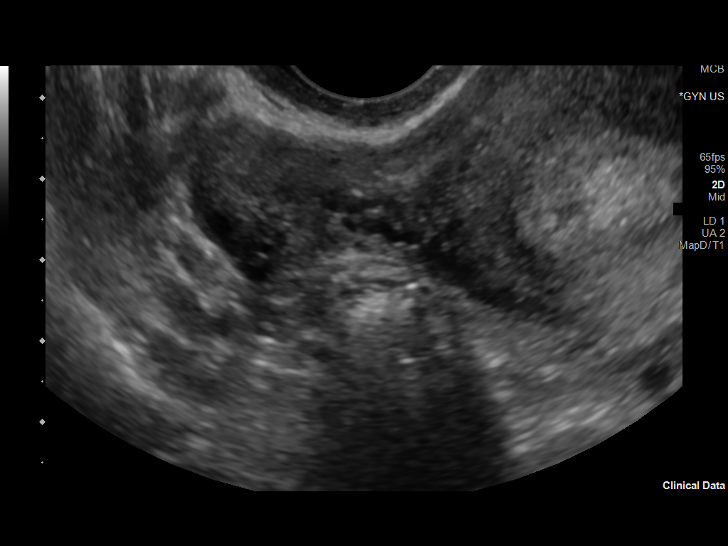
[im 61/91]
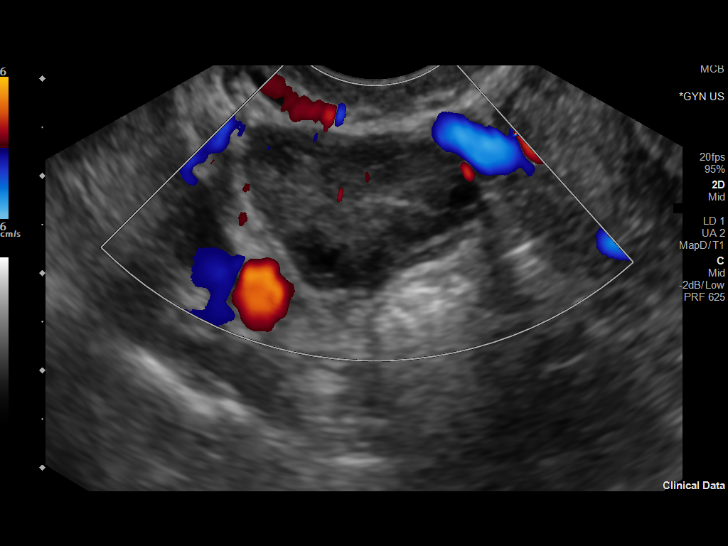
[im 68/91]
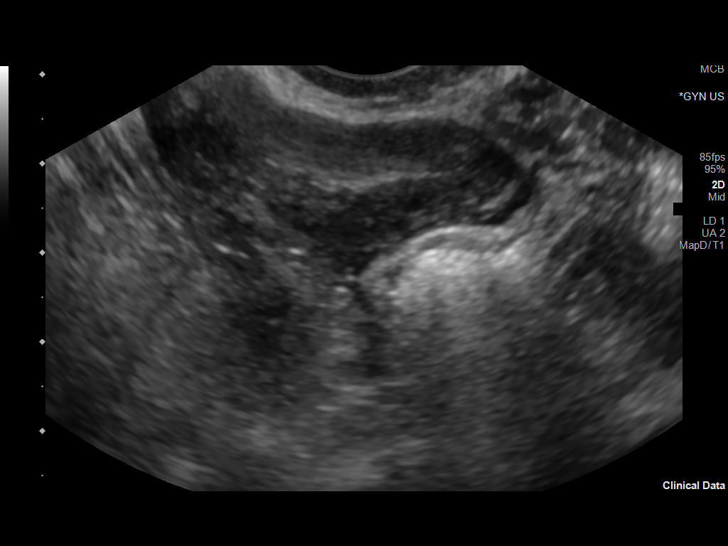
[im 76/91]
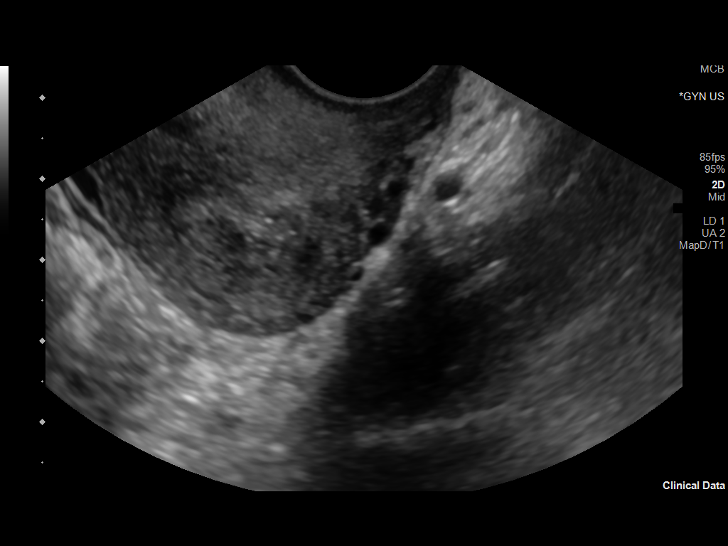
[im 83/91]
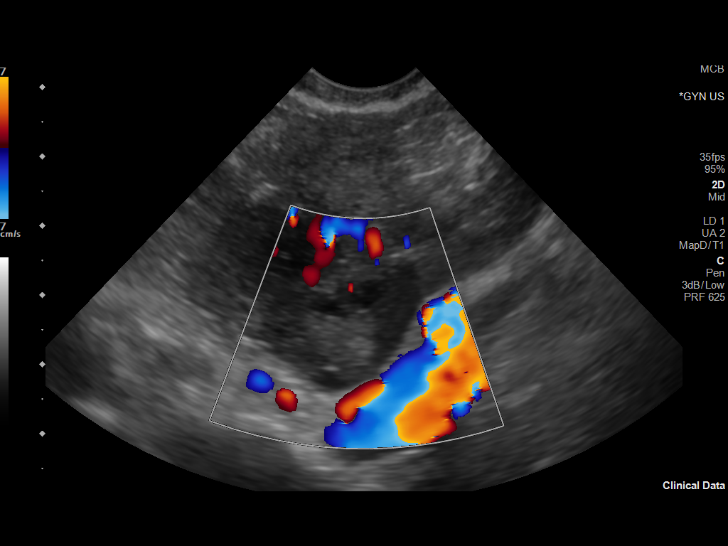
[im 91/91]
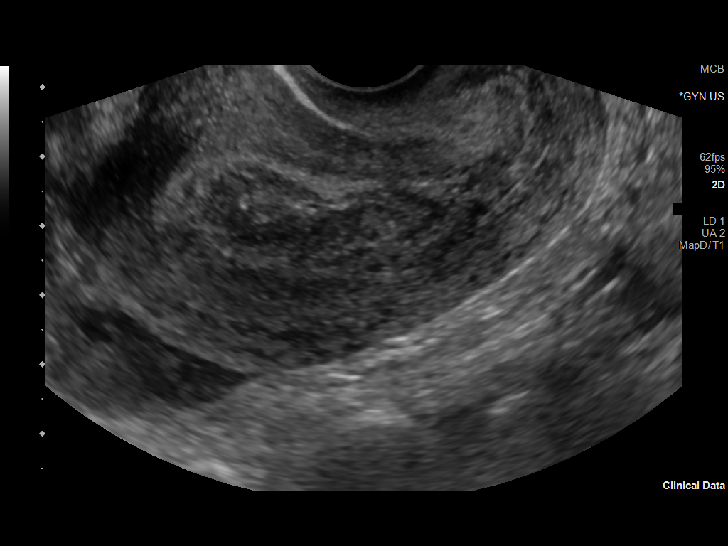

[13 of 25 positions shown; findings below may reference images not displayed]

FINDINGS: Uterus

Measurements: 7.4 x 4.7 x 0.4 cm = volume: 98 mL. No fibroids or
other mass visualized.

Endometrium

Thickness: 21 mm.  No focal abnormality visualized.

Right ovary

Measurements: 3.5 x 1.6 x 2.5 cm = volume: 7 point mL. Normal
appearance/no adnexal mass.

Left ovary

Measurements: 2.9 x 1.7 x 2.6 cm = volume: 6.7 mL. Normal
appearance/no adnexal mass.

Pulsed Doppler evaluation of both ovaries demonstrates normal
low-resistance arterial and venous waveforms.

Other findings

There is a small amount of free fluid about the right adnexa.
IMPRESSION: 1. No evidence for ovarian torsion.
2. Thickened endometrial stripe measuring approximately 2.1 cm.
Consider follow-up pelvic ultrasound in 6-8 weeks in the early
proliferative phase of the menstrual cycle after initiation of
hormonal or medical therapy. If the abnormal uterine bleeding is
already refractory to hormonal or medical therapy, consider
endometrial biopsy, as clinically warranted.

## 2021-08-11 ENCOUNTER — Inpatient Hospital Stay (HOSPITAL_COMMUNITY)
Admission: EM | Admit: 2021-08-11 | Discharge: 2021-08-16 | DRG: 208 | Disposition: A | Discharge status: Home / Self Care | Payer: BC Managed Care – PPO | Attending: Internal Medicine | Admitting: Internal Medicine

## 2021-08-11 ENCOUNTER — Other Ambulatory Visit: Payer: Self-pay

## 2021-08-11 ENCOUNTER — Encounter (HOSPITAL_COMMUNITY): Payer: Self-pay

## 2021-08-11 ENCOUNTER — Inpatient Hospital Stay (HOSPITAL_COMMUNITY): Payer: BC Managed Care – PPO

## 2021-08-11 ENCOUNTER — Emergency Department (HOSPITAL_COMMUNITY): Payer: BC Managed Care – PPO

## 2021-08-11 DIAGNOSIS — Z6831 Body mass index (BMI) 31.0-31.9, adult: Secondary | ICD-10-CM | POA: Diagnosis not present

## 2021-08-11 DIAGNOSIS — J45909 Unspecified asthma, uncomplicated: Secondary | ICD-10-CM

## 2021-08-11 DIAGNOSIS — Z91013 Allergy to seafood: Secondary | ICD-10-CM

## 2021-08-11 DIAGNOSIS — J9601 Acute respiratory failure with hypoxia: Secondary | ICD-10-CM

## 2021-08-11 DIAGNOSIS — E8729 Other acidosis: Secondary | ICD-10-CM | POA: Diagnosis present

## 2021-08-11 DIAGNOSIS — N179 Acute kidney failure, unspecified: Secondary | ICD-10-CM | POA: Diagnosis present

## 2021-08-11 DIAGNOSIS — J45901 Unspecified asthma with (acute) exacerbation: Secondary | ICD-10-CM | POA: Diagnosis present

## 2021-08-11 DIAGNOSIS — J4541 Moderate persistent asthma with (acute) exacerbation: Secondary | ICD-10-CM

## 2021-08-11 DIAGNOSIS — F419 Anxiety disorder, unspecified: Secondary | ICD-10-CM | POA: Diagnosis present

## 2021-08-11 DIAGNOSIS — Z20822 Contact with and (suspected) exposure to covid-19: Secondary | ICD-10-CM | POA: Diagnosis present

## 2021-08-11 DIAGNOSIS — T380X5A Adverse effect of glucocorticoids and synthetic analogues, initial encounter: Secondary | ICD-10-CM | POA: Diagnosis present

## 2021-08-11 DIAGNOSIS — Z79899 Other long term (current) drug therapy: Secondary | ICD-10-CM

## 2021-08-11 DIAGNOSIS — E669 Obesity, unspecified: Secondary | ICD-10-CM | POA: Diagnosis present

## 2021-08-11 DIAGNOSIS — R739 Hyperglycemia, unspecified: Secondary | ICD-10-CM | POA: Diagnosis present

## 2021-08-11 DIAGNOSIS — J45902 Unspecified asthma with status asthmaticus: Secondary | ICD-10-CM

## 2021-08-11 DIAGNOSIS — Z7951 Long term (current) use of inhaled steroids: Secondary | ICD-10-CM | POA: Diagnosis not present

## 2021-08-11 DIAGNOSIS — Z9289 Personal history of other medical treatment: Secondary | ICD-10-CM

## 2021-08-11 DIAGNOSIS — J9602 Acute respiratory failure with hypercapnia: Secondary | ICD-10-CM | POA: Diagnosis present

## 2021-08-11 DIAGNOSIS — I959 Hypotension, unspecified: Secondary | ICD-10-CM | POA: Diagnosis present

## 2021-08-11 DIAGNOSIS — R079 Chest pain, unspecified: Secondary | ICD-10-CM | POA: Diagnosis not present

## 2021-08-11 DIAGNOSIS — I248 Other forms of acute ischemic heart disease: Secondary | ICD-10-CM | POA: Diagnosis present

## 2021-08-11 DIAGNOSIS — B9789 Other viral agents as the cause of diseases classified elsewhere: Secondary | ICD-10-CM | POA: Diagnosis present

## 2021-08-11 DIAGNOSIS — R0602 Shortness of breath: Secondary | ICD-10-CM | POA: Diagnosis present

## 2021-08-11 LAB — I-STAT VENOUS BLOOD GAS, ED
Acid-base deficit: 4 mmol/L — ABNORMAL HIGH (ref 0.0–2.0)
Bicarbonate: 17 mmol/L — ABNORMAL LOW (ref 20.0–28.0)
Calcium, Ion: 0.67 mmol/L — CL (ref 1.15–1.40)
HCT: 42 % (ref 36.0–46.0)
Hemoglobin: 14.3 g/dL (ref 12.0–15.0)
O2 Saturation: 100 %
Potassium: 3.5 mmol/L (ref 3.5–5.1)
Sodium: 130 mmol/L — ABNORMAL LOW (ref 135–145)
TCO2: 18 mmol/L — ABNORMAL LOW (ref 22–32)
pCO2, Ven: 21.5 mmHg — ABNORMAL LOW (ref 44.0–60.0)
pH, Ven: 7.507 — ABNORMAL HIGH (ref 7.250–7.430)
pO2, Ven: 179 mmHg — ABNORMAL HIGH (ref 32.0–45.0)

## 2021-08-11 LAB — I-STAT ARTERIAL BLOOD GAS, ED
Acid-base deficit: 4 mmol/L — ABNORMAL HIGH (ref 0.0–2.0)
Bicarbonate: 23.8 mmol/L (ref 20.0–28.0)
Calcium, Ion: 1.22 mmol/L (ref 1.15–1.40)
HCT: 45 % (ref 36.0–46.0)
Hemoglobin: 15.3 g/dL — ABNORMAL HIGH (ref 12.0–15.0)
O2 Saturation: 100 %
Patient temperature: 99
Potassium: 5.3 mmol/L — ABNORMAL HIGH (ref 3.5–5.1)
Sodium: 137 mmol/L (ref 135–145)
TCO2: 25 mmol/L (ref 22–32)
pCO2 arterial: 55.2 mmHg — ABNORMAL HIGH (ref 32.0–48.0)
pH, Arterial: 7.245 — ABNORMAL LOW (ref 7.350–7.450)
pO2, Arterial: 247 mmHg — ABNORMAL HIGH (ref 83.0–108.0)

## 2021-08-11 LAB — COMPREHENSIVE METABOLIC PANEL
ALT: 33 U/L (ref 0–44)
AST: 24 U/L (ref 15–41)
Albumin: 3.7 g/dL (ref 3.5–5.0)
Alkaline Phosphatase: 96 U/L (ref 38–126)
Anion gap: 11 (ref 5–15)
BUN: 7 mg/dL (ref 6–20)
CO2: 23 mmol/L (ref 22–32)
Calcium: 8.3 mg/dL — ABNORMAL LOW (ref 8.9–10.3)
Chloride: 102 mmol/L (ref 98–111)
Creatinine, Ser: 1 mg/dL (ref 0.44–1.00)
GFR, Estimated: >60 mL/min (ref >60)
Glucose, Bld: 210 mg/dL — ABNORMAL HIGH (ref 70–99)
Potassium: 3.6 mmol/L (ref 3.5–5.1)
Sodium: 136 mmol/L (ref 135–145)
Total Bilirubin: <0.1 mg/dL — ABNORMAL LOW (ref 0.3–1.2)
Total Protein: 7 g/dL (ref 6.5–8.1)

## 2021-08-11 LAB — POCT I-STAT 7, (LYTES, BLD GAS, ICA,H+H)
Acid-base deficit: 7 mmol/L — ABNORMAL HIGH (ref 0.0–2.0)
Bicarbonate: 22.2 mmol/L (ref 20.0–28.0)
Calcium, Ion: 1.21 mmol/L (ref 1.15–1.40)
HCT: 42 % (ref 36.0–46.0)
Hemoglobin: 14.3 g/dL (ref 12.0–15.0)
O2 Saturation: 98 %
Patient temperature: 98.6
Potassium: 7.5 mmol/L (ref 3.5–5.1)
Sodium: 131 mmol/L — ABNORMAL LOW (ref 135–145)
TCO2: 24 mmol/L (ref 22–32)
pCO2 arterial: 58 mmHg — ABNORMAL HIGH (ref 32.0–48.0)
pH, Arterial: 7.19 — CL (ref 7.350–7.450)
pO2, Arterial: 124 mmHg — ABNORMAL HIGH (ref 83.0–108.0)

## 2021-08-11 LAB — CBC WITH DIFFERENTIAL/PLATELET
Abs Immature Granulocytes: 0.11 10*3/uL — ABNORMAL HIGH (ref 0.00–0.07)
Basophils Absolute: 0 10*3/uL (ref 0.0–0.1)
Basophils Relative: 0 %
Eosinophils Absolute: 0.2 10*3/uL (ref 0.0–0.5)
Eosinophils Relative: 1 %
HCT: 41.8 % (ref 36.0–46.0)
Hemoglobin: 12.8 g/dL (ref 12.0–15.0)
Immature Granulocytes: 1 %
Lymphocytes Relative: 5 %
Lymphs Abs: 0.9 10*3/uL (ref 0.7–4.0)
MCH: 24 pg — ABNORMAL LOW (ref 26.0–34.0)
MCHC: 30.6 g/dL (ref 30.0–36.0)
MCV: 78.4 fL — ABNORMAL LOW (ref 80.0–100.0)
Monocytes Absolute: 0.4 10*3/uL (ref 0.1–1.0)
Monocytes Relative: 2 %
Neutro Abs: 18.4 10*3/uL — ABNORMAL HIGH (ref 1.7–7.7)
Neutrophils Relative %: 91 %
Platelets: 427 10*3/uL — ABNORMAL HIGH (ref 150–400)
RBC: 5.33 MIL/uL — ABNORMAL HIGH (ref 3.87–5.11)
RDW: 17.2 % — ABNORMAL HIGH (ref 11.5–15.5)
WBC: 20 10*3/uL — ABNORMAL HIGH (ref 4.0–10.5)
nRBC: 0 % (ref 0.0–0.2)

## 2021-08-11 LAB — RESP PANEL BY RT-PCR (FLU A&B, COVID) ARPGX2
Influenza A by PCR: NEGATIVE
Influenza B by PCR: NEGATIVE
SARS Coronavirus 2 by RT PCR: NEGATIVE

## 2021-08-11 LAB — TROPONIN I (HIGH SENSITIVITY)
Troponin I (High Sensitivity): 85 ng/L — ABNORMAL HIGH (ref <18)
Troponin I (High Sensitivity): 175 ng/L (ref <18)

## 2021-08-11 LAB — BRAIN NATRIURETIC PEPTIDE: B Natriuretic Peptide: 32.2 pg/mL (ref 0.0–100.0)

## 2021-08-11 LAB — GLUCOSE, CAPILLARY: Glucose-Capillary: 156 mg/dL — ABNORMAL HIGH (ref 70–99)

## 2021-08-11 LAB — HIV ANTIBODY (ROUTINE TESTING W REFLEX): HIV Screen 4th Generation wRfx: NONREACTIVE

## 2021-08-11 LAB — MRSA NEXT GEN BY PCR, NASAL: MRSA by PCR Next Gen: NOT DETECTED

## 2021-08-11 MED ORDER — IPRATROPIUM-ALBUTEROL 0.5-2.5 (3) MG/3ML IN SOLN
3.0000 mL | RESPIRATORY_TRACT | Status: DC
  Administered 2021-08-11: 23:00:00 3 mL via RESPIRATORY_TRACT
  Administered 2021-08-11: 17:00:00 12 mL via RESPIRATORY_TRACT
  Administered 2021-08-12 – 2021-08-14 (×16): 3 mL via RESPIRATORY_TRACT
  Filled 2021-08-11 (×4): qty 3
  Filled 2021-08-11: qty 12
  Filled 2021-08-11 (×13): qty 3

## 2021-08-11 MED ORDER — ORAL CARE MOUTH RINSE
15.0000 mL | OROMUCOSAL | Status: DC
  Administered 2021-08-12 – 2021-08-13 (×14): 15 mL via OROMUCOSAL

## 2021-08-11 MED ORDER — MAGNESIUM SULFATE 2 GM/50ML IV SOLN
2.0000 g | Freq: Once | INTRAVENOUS | Status: AC
  Administered 2021-08-11: 2 g via INTRAVENOUS
  Filled 2021-08-11: qty 50

## 2021-08-11 MED ORDER — PROPOFOL 1000 MG/100ML IV EMUL
25.0000 ug/kg/min | INTRAVENOUS | Status: DC
Start: 2021-08-12 — End: 2021-08-13
  Administered 2021-08-12 (×2): 40 ug/kg/min via INTRAVENOUS
  Administered 2021-08-12: 07:00:00 30 ug/kg/min via INTRAVENOUS
  Administered 2021-08-12 – 2021-08-13 (×3): 40 ug/kg/min via INTRAVENOUS
  Filled 2021-08-11 (×6): qty 100

## 2021-08-11 MED ORDER — CHLORHEXIDINE GLUCONATE 0.12% ORAL RINSE (MEDLINE KIT)
15.0000 mL | Freq: Two times a day (BID) | OROMUCOSAL | Status: DC
  Administered 2021-08-11 – 2021-08-13 (×4): 15 mL via OROMUCOSAL

## 2021-08-11 MED ORDER — CHLORHEXIDINE GLUCONATE CLOTH 2 % EX PADS
6.0000 | MEDICATED_PAD | Freq: Every day | CUTANEOUS | Status: DC
  Administered 2021-08-11 – 2021-08-16 (×6): 6 via TOPICAL

## 2021-08-11 MED ORDER — DOCUSATE SODIUM 100 MG PO CAPS: 100.0000 mg | ORAL_CAPSULE | Freq: Two times a day (BID) | ORAL | Status: DC | PRN

## 2021-08-11 MED ORDER — PROPOFOL 1000 MG/100ML IV EMUL
INTRAVENOUS | Status: AC
  Filled 2021-08-11: qty 100

## 2021-08-11 MED ORDER — CALCIUM GLUCONATE-NACL 1-0.675 GM/50ML-% IV SOLN
1.0000 g | Freq: Once | INTRAVENOUS | Status: AC
  Administered 2021-08-11: 20:00:00 1000 mg via INTRAVENOUS
  Filled 2021-08-11: qty 50

## 2021-08-11 MED ORDER — DIAZEPAM 5 MG/ML IJ SOLN
5.0000 mg | Freq: Once | INTRAMUSCULAR | Status: AC
  Administered 2021-08-11: 15:00:00 5 mg via INTRAVENOUS
  Filled 2021-08-11: qty 2

## 2021-08-11 MED ORDER — ARTIFICIAL TEARS OPHTHALMIC OINT
1.0000 "application " | TOPICAL_OINTMENT | Freq: Three times a day (TID) | OPHTHALMIC | Status: DC
  Administered 2021-08-12 (×2): 1 via OPHTHALMIC
  Filled 2021-08-11 (×2): qty 3.5

## 2021-08-11 MED ORDER — MONTELUKAST SODIUM 10 MG PO TABS
10.0000 mg | ORAL_TABLET | Freq: Every day | ORAL | Status: DC
  Administered 2021-08-12: 21:00:00 10 mg
  Filled 2021-08-11: qty 1

## 2021-08-11 MED ORDER — LACTATED RINGERS IV SOLN: INTRAVENOUS | Status: DC

## 2021-08-11 MED ORDER — MIDAZOLAM HCL 2 MG/2ML IJ SOLN
2.0000 mg | INTRAMUSCULAR | Status: AC | PRN
  Administered 2021-08-11 (×2): 2 mg via INTRAVENOUS
  Filled 2021-08-11 (×2): qty 2

## 2021-08-11 MED ORDER — DIAZEPAM 5 MG PO TABS: 5.0000 mg | ORAL_TABLET | Freq: Once | ORAL | Status: DC

## 2021-08-11 MED ORDER — ROCURONIUM BROMIDE 10 MG/ML (PF) SYRINGE
PREFILLED_SYRINGE | INTRAVENOUS | Status: AC
  Filled 2021-08-11: qty 10

## 2021-08-11 MED ORDER — POLYETHYLENE GLYCOL 3350 17 G PO PACK: 17.0000 g | PACK | Freq: Every day | ORAL | Status: DC | PRN

## 2021-08-11 MED ORDER — FENTANYL CITRATE PF 50 MCG/ML IJ SOSY
PREFILLED_SYRINGE | INTRAMUSCULAR | Status: AC
  Filled 2021-08-11: qty 1

## 2021-08-11 MED ORDER — MONTELUKAST SODIUM 10 MG PO TABS
10.0000 mg | ORAL_TABLET | Freq: Every day | ORAL | Status: DC
  Filled 2021-08-11: qty 1

## 2021-08-11 MED ORDER — MIDAZOLAM HCL 2 MG/2ML IJ SOLN
2.0000 mg | INTRAMUSCULAR | Status: DC | PRN
  Administered 2021-08-11: 22:00:00 2 mg via INTRAVENOUS
  Filled 2021-08-11: qty 2

## 2021-08-11 MED ORDER — MIDAZOLAM HCL 2 MG/2ML IJ SOLN
INTRAMUSCULAR | Status: AC
  Administered 2021-08-11: 20:00:00 2 mg via INTRAVENOUS
  Filled 2021-08-11: qty 2

## 2021-08-11 MED ORDER — FENTANYL CITRATE PF 50 MCG/ML IJ SOSY
50.0000 ug | PREFILLED_SYRINGE | Freq: Once | INTRAMUSCULAR | Status: AC
  Administered 2021-08-11: 20:00:00 50 ug via INTRAVENOUS

## 2021-08-11 MED ORDER — FENTANYL BOLUS VIA INFUSION
50.0000 ug | INTRAVENOUS | Status: DC | PRN
  Administered 2021-08-12 – 2021-08-13 (×6): 100 ug via INTRAVENOUS
  Filled 2021-08-11: qty 100

## 2021-08-11 MED ORDER — DOCUSATE SODIUM 50 MG/5ML PO LIQD: 100.0000 mg | Freq: Two times a day (BID) | ORAL | Status: DC | PRN

## 2021-08-11 MED ORDER — POLYETHYLENE GLYCOL 3350 17 G PO PACK
17.0000 g | PACK | Freq: Every day | ORAL | Status: DC
  Administered 2021-08-12 – 2021-08-13 (×3): 17 g
  Filled 2021-08-11 (×2): qty 1

## 2021-08-11 MED ORDER — ENOXAPARIN SODIUM 40 MG/0.4ML IJ SOSY
40.0000 mg | PREFILLED_SYRINGE | INTRAMUSCULAR | Status: DC
  Administered 2021-08-11 – 2021-08-16 (×6): 40 mg via SUBCUTANEOUS
  Filled 2021-08-11 (×6): qty 0.4

## 2021-08-11 MED ORDER — CISATRACURIUM BESYLATE 20 MG/10ML IV SOLN
0.1000 mg/kg | INTRAVENOUS | Status: DC | PRN
  Administered 2021-08-12: 8.4 mg via INTRAVENOUS
  Filled 2021-08-11 (×2): qty 10

## 2021-08-11 MED ORDER — ETOMIDATE 2 MG/ML IV SOLN
INTRAVENOUS | Status: AC
  Filled 2021-08-11: qty 10

## 2021-08-11 MED ORDER — VITAMIN D (ERGOCALCIFEROL) 1.25 MG (50000 UNIT) PO CAPS
50000.0000 [IU] | ORAL_CAPSULE | ORAL | Status: DC
Start: 2021-08-11 — End: 2021-08-16
  Filled 2021-08-11: qty 1

## 2021-08-11 MED ORDER — ALBUTEROL SULFATE (2.5 MG/3ML) 0.083% IN NEBU
10.0000 mg/h | INHALATION_SOLUTION | RESPIRATORY_TRACT | Status: DC
  Administered 2021-08-11: 11:00:00 10 mg/h via RESPIRATORY_TRACT
  Filled 2021-08-11: qty 12

## 2021-08-11 MED ORDER — ENOXAPARIN SODIUM 40 MG/0.4ML IJ SOSY: 40.0000 mg | PREFILLED_SYRINGE | INTRAMUSCULAR | Status: DC

## 2021-08-11 MED ORDER — FENTANYL 2500MCG IN NS 250ML (10MCG/ML) PREMIX INFUSION
50.0000 ug/h | INTRAVENOUS | Status: DC
  Administered 2021-08-11 (×3): 100 ug/h via INTRAVENOUS
  Administered 2021-08-12: 12:00:00 125 ug/h via INTRAVENOUS
  Administered 2021-08-12: 23:00:00 150 ug/h via INTRAVENOUS
  Filled 2021-08-11 (×3): qty 250

## 2021-08-11 MED ORDER — PROPOFOL 1000 MG/100ML IV EMUL
INTRAVENOUS | Status: AC
  Administered 2021-08-11: 20:00:00 45 ug/kg/min
  Filled 2021-08-11: qty 100

## 2021-08-11 MED ORDER — METHYLPREDNISOLONE SODIUM SUCC 125 MG IJ SOLR
60.0000 mg | Freq: Two times a day (BID) | INTRAMUSCULAR | Status: DC
  Administered 2021-08-11: 20:00:00 60 mg via INTRAVENOUS
  Filled 2021-08-11: qty 2

## 2021-08-11 MED ORDER — MOMETASONE FURO-FORMOTEROL FUM 200-5 MCG/ACT IN AERO
2.0000 | INHALATION_SPRAY | Freq: Two times a day (BID) | RESPIRATORY_TRACT | Status: DC
  Administered 2021-08-13 – 2021-08-16 (×6): 2 via RESPIRATORY_TRACT
  Filled 2021-08-11: qty 8.8

## 2021-08-11 MED ORDER — DOCUSATE SODIUM 50 MG/5ML PO LIQD
100.0000 mg | Freq: Two times a day (BID) | ORAL | Status: DC
  Administered 2021-08-12: 100 mg
  Filled 2021-08-11: qty 10

## 2021-08-11 MED ORDER — PROPOFOL 1000 MG/100ML IV EMUL
0.0000 ug/kg/min | INTRAVENOUS | Status: DC
  Administered 2021-08-11: 23:00:00 30 ug/kg/min via INTRAVENOUS

## 2021-08-11 MED ORDER — POLYETHYLENE GLYCOL 3350 17 G PO PACK
17.0000 g | PACK | Freq: Every day | ORAL | Status: DC
  Filled 2021-08-11: qty 1

## 2021-08-11 MED ORDER — DOCUSATE SODIUM 50 MG/5ML PO LIQD
100.0000 mg | Freq: Two times a day (BID) | ORAL | Status: DC
  Administered 2021-08-12 – 2021-08-13 (×3): 100 mg
  Filled 2021-08-11 (×3): qty 10

## 2021-08-11 MED ORDER — ONDANSETRON HCL 4 MG/2ML IJ SOLN
4.0000 mg | Freq: Once | INTRAMUSCULAR | Status: AC
  Administered 2021-08-11: 10:00:00 4 mg via INTRAVENOUS
  Filled 2021-08-11: qty 2

## 2021-08-11 MED ORDER — ALBUTEROL SULFATE (2.5 MG/3ML) 0.083% IN NEBU
3.0000 mL | INHALATION_SOLUTION | RESPIRATORY_TRACT | Status: DC | PRN
  Administered 2021-08-13: 11:00:00 3 mL via RESPIRATORY_TRACT
  Filled 2021-08-11: qty 3

## 2021-08-11 MED ORDER — LACTATED RINGERS IV SOLN
INTRAVENOUS | Status: AC
Start: 2021-08-11 — End: 2021-08-12

## 2021-08-11 MED ORDER — ALBUTEROL SULFATE (2.5 MG/3ML) 0.083% IN NEBU
20.0000 mg | INHALATION_SOLUTION | Freq: Once | RESPIRATORY_TRACT | Status: AC
  Administered 2021-08-12: 20 mg via RESPIRATORY_TRACT
  Filled 2021-08-11: qty 24

## 2021-08-11 NOTE — Progress Notes (Signed)
Patient transported from D34 to 2M03 without incidence. Report given to receiving RT.
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
## 2021-08-11 NOTE — Progress Notes (Signed)
FPTS Interim Progress Note  S: Called to patient's bedside as patient was having difficulty with breathing and not able to tolerate the BiPAP.  When talking with the patient, she reports that she has not had much improvement with her breathing since this morning and the BiPAP has not been helping.  She feels like her mouth and throat are really dry and is really wanting water at this time.  Discussing the case with the nurse, patient has received several treatments already and has not had any relief in her respiratory symptoms.  She is still struggling to breathe and this has not abated since her arrival.  O: BP (!) 136/95    Pulse (!) 137    Temp 99 F (37.2 C) (Oral)    Resp (!) 24    Ht 5\' 2"  (1.575 m)    Wt 77.1 kg    SpO2 97%    BMI 31.09 kg/m   General: In acute respiratory distress Respiratory: Patient with BiPAP in place, tachypneic to 26, increased accessory muscle use, significant work of breathing Cardiac: Tachycardia to the 130s noted on telemetry  A/P: Significant respiratory distress   asthma exacerbation Patient does not appear at all improved since admission exam.  Patient has not had relief in her respiratory distress or work of breathing since her arrival this morning.  At this time, becoming concerned that patient may tire herself out.  Discussed the plan with attending Dr. who recommended attempting another CAT treatment, discussed this with RT who will start the administration.   - Consulted pulmonology/CCM, who will go see to evaluate the patient. - Close monitoring - Trial CAT   Keawe Marcello, DO 08/11/2021, 4:53 PM PGY-2, [facility] Family Medicine Service pager [phone]

## 2021-08-11 NOTE — ED Notes (Signed)
RN assumed care and pt was actively attempting to extubate self.

## 2021-08-11 NOTE — Procedures (Signed)
Intubation Procedure Note  Brenda Acevedo  [id number]  March 04, 1996  Date:08/11/21  Time:6:26 PM   Provider Performing:Zi Newbury    Procedure: Intubation (31500)  Indication(s) Respiratory Failure  Consent Risks of the procedure as well as the alternatives and risks of each were explained to the patient and/or caregiver.  Consent for the procedure was obtained and is signed in the bedside chart   Anesthesia Etomidate and Rocuronium   Time Out Verified patient identification, verified procedure, site/side was marked, verified correct patient position, special equipment/implants available, medications/allergies/relevant history reviewed, required imaging and test results available.   Sterile Technique Usual hand hygeine, masks, and gloves were used   Procedure Description Patient positioned in bed supine.  Sedation given as noted above.  Patient was intubated with endotracheal tube using  MAC 4 .  View was Grade 1 full glottis .  Number of attempts was 1.  Colorimetric CO2 detector was consistent with tracheal placement.   Complications/Tolerance None; patient tolerated the procedure well. Chest X-ray is ordered to verify placement.   EBL Minimal   Specimen(s) None
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
## 2021-08-11 NOTE — ED Notes (Signed)
Placed on high flow by RT. Patient unable to tolerate being of of Bipap but a short time. MD at bedside to evaluate. RT called to place patient back on Bipap.

## 2021-08-11 NOTE — Progress Notes (Addendum)
eLink Physician-Brief Progress Note Patient Name: Brenda Acevedo DOB: 1995/10/23 MRN: [id number]   Date of Service  08/11/2021  HPI/Events of Note  25 year old woman with acute asthma exacerbation. Arrived to ICU intubated and sedated deeply while on propofol and fentanyl drips. HR in 120s. BP stable. O2 99 on PRVC fio2 50, peep 5, RR set at 30, VT 400. BMI is 31.   eICU Interventions  Do not see post intubation cxr and abg Ordered those to be done now On nebs, steroids      Intervention Category Major Interventions: Respiratory failure - evaluation and management Evaluation Type: New Patient Evaluation  Oretha Milch 08/11/2021, 10:30 PM  Addendum at 11:40 pm  Called due to severe breath stacking Seen on camera Am told patient sounds very tight and poor air entry Also noted ABG  RASS is -5 on propofol and fentanyl HR in 130s BP stable Will give a dose of paralytic, continuous albuterol neb x 1 and 2 gram magnesium Will recheck an abg in 1 hour or so Have also asked ground CCM team to assess   Addendum at 1:50 am K is still pending- clarified and it has been sent Also noted that CCM had seen the patient and adjusted vent  I ordered repeat abg for 2 am  Prior BMP and ABG K not correlating and we already gave a large albuterol dose and she got calcium earlier as well so will assess now with new BMP that has been pending  Addendum at 3:!5 am ABG noted, improved Lower Fio2, continue other settings K is normal on this ABG but the BMP is still pending Call us when that results   Addendum at 5 am Noted BMP. K is normal , Worsening creatinine Bladder scan with 490 cc Given her condition, will place foley  Monitor UO and renal function   Addendum at 5:55 am Notified of softer Bps, SBP in 80s RN had turned off propofol Will give LR bolus Also ordered Neosynephrine if needed since we need her to be well sedated with her acute asthma

## 2021-08-11 NOTE — ED Notes (Signed)
Respirations remains labored on Bipap. Patient states she really needs to try something else. Wheezing bilaterally with decreased air movement. O2 sats 99%. Attending MD and R.T called to evaluate.

## 2021-08-11 NOTE — Progress Notes (Signed)
Pt placed on BiPAP for about 10 minutes. Pt became nauseated. BIPAP removed and pt placed HFNC 12L.

## 2021-08-11 NOTE — Consult Note (Addendum)
NAME:  Brenda Acevedo, MRN:  [id number], DOB:  [birth year], LOS: 0 ADMISSION DATE:  08/11/2021, CONSULTATION DATE:  08/11/2021 REFERRING MD:  Evelena Leyden, DO , CHIEF COMPLAINT: Shortness of breath  History of Present Illness:  25 year old female with history of asthma was brought in the emergency department with a complaint of shortness of breath for few days. Patient received IV Solu-Medrol, magnesium and continuous nebs, without improvement, she was brought in the emergency department where she was put on BiPAP with some improvement in her work of breathing but transition from BiPAP to nasal cannula oxygen led to increasing shortness of breath and chest pressure.  Her initial ABG was 7.5 and PCO2 21, but she started getting tired out, repeat ABG showed PCO2 55 with pH 7.25, and patient is started complaining of being tired and not able to breathe. Patient denies fever, chills, nausea, vomiting, dysuria, urgency, frequency or other complaints  Pertinent  Medical History   Past Medical History:  Diagnosis Date   Asthma      Significant Hospital Events: Including procedures, antibiotic start and stop dates in addition to other pertinent events     Interim History / Subjective:    Objective   Blood pressure (!) 136/95, pulse (!) 137, temperature 99 F (37.2 C), temperature source Oral, resp. rate (!) 24, height 5\' 2"  (1.575 m), weight 77.1 kg, SpO2 97 %.    FiO2 (%):  [60 %] 60 %  No intake or output data in the 24 hours ending 08/11/21 1732 Filed Weights   08/11/21 1017  Weight: 77.1 kg    Examination: General: Crtitically ill-appearing young obese female, on BiPAP HEENT: [place]/AT, eyes anicteric.  Moist mucous membranes Neuro: Alert, awake, following commands, moving all 4 extremities Chest: Bilateral diffuse wheezing inspiratory and expiratory Heart: Tachycardic, regular rhythm, no murmurs or gallops Abdomen: Soft, nontender, nondistended, bowel sounds present Skin: No  rash  Resolved Hospital Problem list     Assessment & Plan:  Acute hypoxic/hypercapnic respiratory failure Acute severe asthma exacerbation Acute respiratory acidosis Hypocalcemia Demand cardiac ischemia  Patient is on BiPAP but she is getting tired out with respiratory acidosis Will proceed with endotracheal intubation Continue lung protective ventilation Maintain peak and plateau pressure below 30 and driving pressure below 15 Continue IV Solu-Medrol 40 mg twice daily Continue albuterol nebs continuous Will adjust vent setting to clear hypercapnia She will be admitted to ICU for close monitoring Patient received 1 g of calcium Repeat calcium is within normal limits Serum troponin is slightly raised likely due to demand cardiac ischemia Will obtain echocardiogram Continue to trend EKG  Best Practice (right click and "Reselect all SmartList Selections" daily)   Diet/type: NPO DVT prophylaxis: LMWH GI prophylaxis: Follow-up Line: NA Foley:  N/A Code Status:  full code Last date of multidisciplinary goals of care discussion [pending]  Labs   CBC: Recent Labs  Lab 08/11/21 1051 08/11/21 1111 08/11/21 1723  WBC 20.0*  --   --   NEUTROABS 18.4*  --   --   HGB 12.8 14.3 15.3*  HCT 41.8 42.0 45.0  MCV 78.4*  --   --   PLT 427*  --   --     Basic Metabolic Panel: Recent Labs  Lab 08/11/21 1051 08/11/21 1111 08/11/21 1723  NA 136 130* 137  K 3.6 3.5 5.3*  CL 102  --   --   CO2 23  --   --   GLUCOSE 210*  --   --  BUN 7  --   --   CREATININE 1.00  --   --   CALCIUM 8.3*  --   --    GFR: Estimated Creatinine Clearance: 82 mL/min (by C-G formula based on SCr of 1 mg/dL). Recent Labs  Lab 08/11/21 1051  WBC 20.0*    Liver Function Tests: Recent Labs  Lab 08/11/21 1051  AST 24  ALT 33  ALKPHOS 96  BILITOT <0.1*  PROT 7.0  ALBUMIN 3.7   No results for input(s): LIPASE, AMYLASE in the last 168 hours. No results for input(s): AMMONIA in the last  168 hours.  ABG    Component Value Date/Time   PHART 7.245 (L) 08/11/2021 1723   PCO2ART 55.2 (H) 08/11/2021 1723   PO2ART 247 (H) 08/11/2021 1723   HCO3 23.8 08/11/2021 1723   TCO2 25 08/11/2021 1723   ACIDBASEDEF 4.0 (H) 08/11/2021 1723   O2SAT 100.0 08/11/2021 1723     Coagulation Profile: No results for input(s): INR, PROTIME in the last 168 hours.  Cardiac Enzymes: No results for input(s): CKTOTAL, CKMB, CKMBINDEX, TROPONINI in the last 168 hours.  HbA1C: No results found for: HGBA1C  CBG: No results for input(s): GLUCAP in the last 168 hours.  Review of Systems:   12 point review of system is significant for complaint mentioned in the HPI rest is negative  Past Medical History:  She,  has a past medical history of Asthma.   Surgical History:  History reviewed. No pertinent surgical history.   Social History:   reports that she has never smoked. She has never used smokeless tobacco. She reports current alcohol use. She reports that she does not use drugs.   Family History:  Her family history is not on file.   Allergies Allergies  Allergen Reactions   Coconut (Cocos Nucifera) Allergy Skin Test Other (See Comments)   Justicia Adhatoda (Malabar Nut Tree) [Justicia Adhatoda] Other (See Comments)    Tree nuts   Shellfish Allergy Other (See Comments)     Home Medications  Prior to Admission medications   Medication Sig Start Date End Date Taking? Authorizing Provider  albuterol (VENTOLIN HFA) 108 (90 Base) MCG/ACT inhaler Inhale 2 puffs into the lungs every 6 (six) hours as needed for wheezing.   Yes [provider]  montelukast (SINGULAIR) 10 MG tablet Take 1 tablet (10 mg total) by mouth at bedtime. 07/19/20  Yes Glade Lloyd, MD  Vitamin D, Ergocalciferol, (DRISDOL) 1.25 MG (50000 UNIT) CAPS capsule Take 50,000 Units by mouth once a week. 08/05/21  Yes [provider]  mometasone-formoterol (DULERA) 200-5 MCG/ACT AERO Inhale 2 puffs into  the lungs 2 (two) times daily. 07/19/20   Glade Lloyd, MD     Critical care time:     Total critical care time: 48 minutes  Performed by: Cheri Chrismer   Critical care time was exclusive of separately billable procedures and treating other patients.   Critical care was necessary to treat or prevent imminent or life-threatening deterioration.   Critical care was time spent personally by me on the following activities: development of treatment plan with patient and/or surrogate as well as nursing, discussions with consultants, evaluation of patient's response to treatment, examination of patient, obtaining history from patient or surrogate, ordering and performing treatments and interventions, ordering and review of laboratory studies, ordering and review of radiographic studies, pulse oximetry and re-evaluation of patient's condition.   Cheri Coon MD [place] [facility] See Amion for pager If no  response to pager, please call [phone] until 7pm After 7pm, Please call E-link [phone]

## 2021-08-11 NOTE — ED Notes (Signed)
Patient remains on Bipap. Respirations less labored than on arrival. Patient states she feels a lot better. More air movement noted throughout lung fields. Expiratory wheezing still present, but significantly decreased from arrival.

## 2021-08-11 NOTE — ED Triage Notes (Signed)
"  History of asthma, feeling unwell for a few days, this morning increased shortness of breath. Used her inhaler x 4 with no relief so called EMS. We gave her 15mg  albuterol, 1mg  atropine, 125mg  solumedrol, 2 grams of magnesium and placed her on C-Pap" per ems

## 2021-08-12 ENCOUNTER — Inpatient Hospital Stay (HOSPITAL_COMMUNITY): Payer: BC Managed Care – PPO

## 2021-08-12 DIAGNOSIS — R079 Chest pain, unspecified: Secondary | ICD-10-CM

## 2021-08-12 LAB — PHOSPHORUS
Phosphorus: 4 mg/dL (ref 2.5–4.6)
Phosphorus: 3 mg/dL (ref 2.5–4.6)

## 2021-08-12 LAB — POCT I-STAT 7, (LYTES, BLD GAS, ICA,H+H)
Acid-base deficit: 8 mmol/L — ABNORMAL HIGH (ref 0.0–2.0)
Acid-base deficit: 1 mmol/L (ref 0.0–2.0)
Bicarbonate: 18.3 mmol/L — ABNORMAL LOW (ref 20.0–28.0)
Bicarbonate: 23.1 mmol/L (ref 20.0–28.0)
Calcium, Ion: 1.19 mmol/L (ref 1.15–1.40)
Calcium, Ion: 1.24 mmol/L (ref 1.15–1.40)
HCT: 39 % (ref 36.0–46.0)
HCT: 33 % — ABNORMAL LOW (ref 36.0–46.0)
Hemoglobin: 13.3 g/dL (ref 12.0–15.0)
Hemoglobin: 11.2 g/dL — ABNORMAL LOW (ref 12.0–15.0)
O2 Saturation: 100 %
O2 Saturation: 99 %
Patient temperature: 98.1
Patient temperature: 98.4
Potassium: 4.3 mmol/L (ref 3.5–5.1)
Potassium: 4.5 mmol/L (ref 3.5–5.1)
Sodium: 135 mmol/L (ref 135–145)
Sodium: 135 mmol/L (ref 135–145)
TCO2: 20 mmol/L — ABNORMAL LOW (ref 22–32)
TCO2: 24 mmol/L (ref 22–32)
pCO2 arterial: 40.8 mmHg (ref 32.0–48.0)
pCO2 arterial: 34.3 mmHg (ref 32.0–48.0)
pH, Arterial: 7.259 — ABNORMAL LOW (ref 7.350–7.450)
pH, Arterial: 7.435 (ref 7.350–7.450)
pO2, Arterial: 211 mmHg — ABNORMAL HIGH (ref 83.0–108.0)
pO2, Arterial: 116 mmHg — ABNORMAL HIGH (ref 83.0–108.0)

## 2021-08-12 LAB — ECHOCARDIOGRAM COMPLETE
AR max vel: 2.05 cm2
AV Area VTI: 2.57 cm2
AV Area mean vel: 2.03 cm2
AV Mean grad: 9 mmHg
AV Peak grad: 17.1 mmHg
Ao pk vel: 2.07 m/s
Area-P 1/2: 6.27 cm2
Calc EF: 65.7 %
Height: 62 in
S' Lateral: 2.1 cm
Single Plane A2C EF: 66.3 %
Single Plane A4C EF: 65.7 %
Weight: 2941.82 oz

## 2021-08-12 LAB — RESPIRATORY PANEL BY PCR

## 2021-08-12 LAB — HEMOGLOBIN A1C
Hgb A1c MFr Bld: 6.1 % — ABNORMAL HIGH (ref 4.8–5.6)
Mean Plasma Glucose: 128.37 mg/dL

## 2021-08-12 LAB — CBC
HCT: 39.9 % (ref 36.0–46.0)
Hemoglobin: 12.2 g/dL (ref 12.0–15.0)
MCH: 24.2 pg — ABNORMAL LOW (ref 26.0–34.0)
MCHC: 30.6 g/dL (ref 30.0–36.0)
MCV: 79 fL — ABNORMAL LOW (ref 80.0–100.0)
Platelets: 422 10*3/uL — ABNORMAL HIGH (ref 150–400)
RBC: 5.05 MIL/uL (ref 3.87–5.11)
RDW: 17.8 % — ABNORMAL HIGH (ref 11.5–15.5)
WBC: 12.3 10*3/uL — ABNORMAL HIGH (ref 4.0–10.5)
nRBC: 0 % (ref 0.0–0.2)

## 2021-08-12 LAB — GLUCOSE, CAPILLARY
Glucose-Capillary: 207 mg/dL — ABNORMAL HIGH (ref 70–99)
Glucose-Capillary: 198 mg/dL — ABNORMAL HIGH (ref 70–99)
Glucose-Capillary: 155 mg/dL — ABNORMAL HIGH (ref 70–99)
Glucose-Capillary: 132 mg/dL — ABNORMAL HIGH (ref 70–99)
Glucose-Capillary: 139 mg/dL — ABNORMAL HIGH (ref 70–99)
Glucose-Capillary: 139 mg/dL — ABNORMAL HIGH (ref 70–99)

## 2021-08-12 LAB — BASIC METABOLIC PANEL
Anion gap: 16 — ABNORMAL HIGH (ref 5–15)
BUN: 17 mg/dL (ref 6–20)
CO2: 17 mmol/L — ABNORMAL LOW (ref 22–32)
Calcium: 8.8 mg/dL — ABNORMAL LOW (ref 8.9–10.3)
Chloride: 103 mmol/L (ref 98–111)
Creatinine, Ser: 2.09 mg/dL — ABNORMAL HIGH (ref 0.44–1.00)
GFR, Estimated: 33 mL/min — ABNORMAL LOW (ref >60)
Glucose, Bld: 232 mg/dL — ABNORMAL HIGH (ref 70–99)
Potassium: 4.5 mmol/L (ref 3.5–5.1)
Sodium: 136 mmol/L (ref 135–145)

## 2021-08-12 LAB — MAGNESIUM
Magnesium: 3 mg/dL — ABNORMAL HIGH (ref 1.7–2.4)
Magnesium: 2.3 mg/dL (ref 1.7–2.4)

## 2021-08-12 LAB — TRIGLYCERIDES: Triglycerides: 168 mg/dL — ABNORMAL HIGH (ref <150)

## 2021-08-12 MED ORDER — ACETAMINOPHEN 325 MG PO TABS
650.0000 mg | ORAL_TABLET | Freq: Four times a day (QID) | ORAL | Status: DC | PRN
  Administered 2021-08-12: 18:00:00 650 mg
  Filled 2021-08-12: qty 2

## 2021-08-12 MED ORDER — LACTATED RINGERS IV BOLUS
500.0000 mL | Freq: Once | INTRAVENOUS | Status: AC
  Administered 2021-08-12: 06:00:00 500 mL via INTRAVENOUS

## 2021-08-12 MED ORDER — SODIUM CHLORIDE 0.9 % IV SOLN: 250.0000 mL | INTRAVENOUS | Status: DC

## 2021-08-12 MED ORDER — METHYLPREDNISOLONE SODIUM SUCC 125 MG IJ SOLR
120.0000 mg | Freq: Two times a day (BID) | INTRAMUSCULAR | Status: DC
Start: 2021-08-12 — End: 2021-08-14
  Administered 2021-08-12 – 2021-08-14 (×6): 120 mg via INTRAVENOUS
  Filled 2021-08-12 (×6): qty 2

## 2021-08-12 MED ORDER — VITAL HIGH PROTEIN PO LIQD: 1000.0000 mL | ORAL | Status: DC

## 2021-08-12 MED ORDER — VITAL AF 1.2 CAL PO LIQD
1000.0000 mL | ORAL | Status: DC
  Administered 2021-08-12: 18:00:00 1000 mL

## 2021-08-12 MED ORDER — PROSOURCE TF PO LIQD
45.0000 mL | Freq: Two times a day (BID) | ORAL | Status: DC
  Administered 2021-08-12: 09:00:00 45 mL
  Filled 2021-08-12: qty 45

## 2021-08-12 MED ORDER — INSULIN ASPART 100 UNIT/ML IJ SOLN
0.0000 [IU] | INTRAMUSCULAR | Status: DC
  Administered 2021-08-12: 13:00:00 4 [IU] via SUBCUTANEOUS
  Administered 2021-08-12 (×2): 3 [IU] via SUBCUTANEOUS
  Administered 2021-08-12: 09:00:00 4 [IU] via SUBCUTANEOUS
  Administered 2021-08-12: 23:00:00 3 [IU] via SUBCUTANEOUS
  Administered 2021-08-13 (×2): 4 [IU] via SUBCUTANEOUS

## 2021-08-12 MED ORDER — PHENYLEPHRINE HCL-NACL 20-0.9 MG/250ML-% IV SOLN
25.0000 ug/min | INTRAVENOUS | Status: DC
  Administered 2021-08-12: 09:00:00 25 ug/min via INTRAVENOUS
  Filled 2021-08-12: qty 250

## 2021-08-12 NOTE — Progress Notes (Signed)
OVERNIGHT COVERAGE CRITICAL CARE PROGRESS NOTE  CTSP re: tachycardia and hypotension.  Admitted with asthma exacerbation, which has required initiation of neuromuscular blockade.  On fentanyl, propofol and Nimbex.  On PRVC 30/400/50/5.  Vent settings adjusted at bedside: 24/500/50/5 to allow more expiratory time.  Follow up ABG in 1 hour.  Critical care time: 30 minutes.  The treatment and management of the patient's condition was required based on the threat of imminent deterioration. This time reflects time spent by the physician evaluating, providing care and managing the critically ill patient's care. The time was spent at the immediate bedside (or on the same floor/unit and dedicated to this patient's care). Time involved in separately billable procedures is NOT included int he critical care time indicated above. Family meeting and update time may be included above if and only if the patient is unable/incompetent to participate in clinical interview and/or decision making, and the discussion was necessary to determining treatment decisions.  Marcelle Smiling, MD Board Certified by the [employer]IM, Pulmonary Diseases & Critical Care Medicine

## 2021-08-12 NOTE — Progress Notes (Addendum)
Initial Nutrition Assessment  DOCUMENTATION CODES:   Not applicable  INTERVENTION:   Initiate tube feeding via OG tube: Vital AF 1.2 at 65 ml/h (1560 ml per day)  Provides 1872 kcal, 117 gm protein, 1265 ml free water daily  NUTRITION DIAGNOSIS:   Inadequate oral intake related to inability to eat as evidenced by NPO status.  GOAL:   Patient will meet greater than or equal to 90% of their needs  MONITOR:   Vent status, TF tolerance, Labs  REASON FOR ASSESSMENT:   Ventilator, Consult Enteral/tube feeding initiation and management  ASSESSMENT:   25 yo female admitted with acute severe asthma exacerbation. PMH includes asthma; shellfish, coconut, and Malabar nut tree allergies.  Received MD Consult for TF initiation and management. OG tube in place.    Patient is currently intubated on ventilator support MV: 11.5 L/min Temp (24hrs), Avg:98.5 F (36.9 C), Min:98.2 F (36.8 C), Max:98.8 F (37.1 C)  Propofol: 7.9 ml/hr providing 209 kcal from lipid.  Labs reviewed. Mag 3 (H), creatinine 2.09 (H), TG 168 (H), A1C 6.1 (H) CBG: 207-198  Medications reviewed and include Colace, Novolog, Solumedrol, Miralax, ergocalciferol, phenylephrine, propofol, Nimbex.  Weight history reviewed. Usual weight 78 kg one year ago. Currently 83.4 kg.   NUTRITION - FOCUSED PHYSICAL EXAM:  Flowsheet Row Most Recent Value  Orbital Region No depletion  Upper Arm Region No depletion  Thoracic and Lumbar Region No depletion  Buccal Region Unable to assess  Temple Region No depletion  Clavicle Bone Region No depletion  Clavicle and Acromion Bone Region No depletion  Scapular Bone Region No depletion  Dorsal Hand No depletion  Patellar Region No depletion  Anterior Thigh Region No depletion  Posterior Calf Region No depletion  Edema (RD Assessment) None  Hair Reviewed  Eyes Reviewed  Mouth Unable to assess  Skin Reviewed  Nails Reviewed       Diet Order:   Diet Order              Diet NPO time specified  Diet effective now                   EDUCATION NEEDS:   No education needs have been identified at this time  Skin:  Skin Assessment: Reviewed RN Assessment  Last BM:  no BM documented  Height:   Ht Readings from Last 1 Encounters:  08/11/21 5\' 2"  (1.575 m)    Weight:   Wt Readings from Last 1 Encounters:  08/12/21 83.4 kg    BMI:  Body mass index is 33.63 kg/m.  Estimated Nutritional Needs:   Kcal:  1850-2050  Protein:  100-125 gm  Fluid:  >/= 2 L   08/14/21, RD, LDN, CNSC Please refer to Amion for contact information.

## 2021-08-12 NOTE — Progress Notes (Signed)
NAME:  Brenda Acevedo, MRN:  [id number], DOB:  [birth year], LOS: 1 ADMISSION DATE:  08/11/2021, CONSULTATION DATE:  08/11/2021 REFERRING MD:  Evelena Leyden CHIEF COMPLAINT:  Shortness of breath   History of Present Illness:  25 year old female with history of asthma was brought in the emergency department with a complaint of shortness of breath for few days. Patient received IV Solu-Medrol, magnesium and continuous nebs, without improvement, she was brought in the emergency department where she was put on BiPAP with some improvement in her work of breathing but transition from BiPAP to nasal cannula oxygen led to increasing shortness of breath and chest pressure.  Her initial ABG was 7.5 and PCO2 21, but she started getting tired out, repeat ABG showed PCO2 55 with pH 7.25, and patient is started complaining of being tired and not able to breathe. Patient denies fever, chills, nausea, vomiting, dysuria, urgency, frequency or other complaints   Pertinent  Medical History  Asthma  Significant Hospital Events: Including procedures, antibiotic start and stop dates in addition to other pertinent events   Bronchospastic overnight  Interim History / Subjective:  On sedation, easily arousable, appears comfortable Still tachycardic  Objective   Blood pressure (!) 90/47, pulse (!) 130, temperature 98.2 F (36.8 C), temperature source Oral, resp. rate (!) 24, height 5\' 2"  (1.575 m), weight 83.4 kg, SpO2 96 %.    Vent Mode: PRVC FiO2 (%):  [40 %-60 %] 40 % Set Rate:  [24 bmp-30 bmp] 24 bmp Vt Set:  [400 mL-500 mL] 500 mL PEEP:  [5 cmH20] 5 cmH20 Plateau Pressure:  [20 cmH20-30 cmH20] 21 cmH20   Intake/Output Summary (Last 24 hours) at 08/12/2021 0759 Last data filed at 08/12/2021 0600 Gross per 24 hour  Intake 508.46 ml  Output 450 ml  Net 58.46 ml   Filed Weights   08/11/21 1017 08/11/21 2200 08/12/21 0255  Weight: 77.1 kg 83.4 kg 83.4 kg    Examination: General: Does not appear  to be in distress HENT: Moist oral mucosa Lungs: Improved wheezing  Cardiovascular: S1-S2 appreciated Abdomen: Soft, bowel sounds appreciated Extremities: No clubbing, no edema Neuro: sedated GU: Fair output  Resolved Hospital Problem list     Assessment & Plan:  Acute severe asthma exacerbation Acute hypoxemic respiratory failure -Bronchodilators-and albuterol -Continue steroids, currently on Solu-Medrol 40 twice a day  Acute hypoxemic respiratory failure -Continue full ventilator support -We will reduce sedation as tolerated -Not ready for extubation -Lung protective strategy -Maintain driving pressure less than 15  Elevated troponin -Secondary to demand ischemia -We will continue to monitor  Patient with acute hypoxemic respiratory failure, risk of decompensation remains very high Not ready for extubation  Best Practice (right click and "Reselect all SmartList Selections" daily)   Diet/type: tubefeeds DVT prophylaxis: LMWH GI prophylaxis: H2B Lines: N/A Foley:  N/A Code Status:  full code Last date of multidisciplinary goals of care discussion [pending]  Labs   CBC: Recent Labs  Lab 08/11/21 1051 08/11/21 1111 08/11/21 1723 08/11/21 2322 08/12/21 0212 08/12/21 0320  WBC 20.0*  --   --   --   --  12.3*  NEUTROABS 18.4*  --   --   --   --   --   HGB 12.8 14.3 15.3* 14.3 13.3 12.2  HCT 41.8 42.0 45.0 42.0 39.0 39.9  MCV 78.4*  --   --   --   --  79.0*  PLT 427*  --   --   --   --  422*    Basic Metabolic Panel: Recent Labs  Lab 08/11/21 1051 08/11/21 1111 08/11/21 1723 08/11/21 2322 08/12/21 0212 08/12/21 0320  NA 136 130* 137 131* 135 136  K 3.6 3.5 5.3* 7.5* 4.3 4.5  CL 102  --   --   --   --  103  CO2 23  --   --   --   --  17*  GLUCOSE 210*  --   --   --   --  232*  BUN 7  --   --   --   --  17  CREATININE 1.00  --   --   --   --  2.09*  CALCIUM 8.3*  --   --   --   --  8.8*  MG  --   --   --   --   --  3.0*  PHOS  --   --   --   --    --  4.0   GFR: Estimated Creatinine Clearance: 40.8 mL/min (A) (by C-G formula based on SCr of 2.09 mg/dL (H)). Recent Labs  Lab 08/11/21 1051 08/12/21 0320  WBC 20.0* 12.3*    Liver Function Tests: Recent Labs  Lab 08/11/21 1051  AST 24  ALT 33  ALKPHOS 96  BILITOT <0.1*  PROT 7.0  ALBUMIN 3.7   No results for input(s): LIPASE, AMYLASE in the last 168 hours. No results for input(s): AMMONIA in the last 168 hours.  ABG    Component Value Date/Time   PHART 7.259 (L) 08/12/2021 0212   PCO2ART 40.8 08/12/2021 0212   PO2ART 211 (H) 08/12/2021 0212   HCO3 18.3 (L) 08/12/2021 0212   TCO2 20 (L) 08/12/2021 0212   ACIDBASEDEF 8.0 (H) 08/12/2021 0212   O2SAT 100.0 08/12/2021 0212     Coagulation Profile: No results for input(s): INR, PROTIME in the last 168 hours.  Cardiac Enzymes: No results for input(s): CKTOTAL, CKMB, CKMBINDEX, TROPONINI in the last 168 hours.  HbA1C: No results found for: HGBA1C  CBG: Recent Labs  Lab 08/11/21 2221 08/12/21 0305 08/12/21 0720  GLUCAP 156* 207* 198*    Review of Systems:   Unobtainable at present  Past Medical History:  She,  has a past medical history of Asthma.   Surgical History:  History reviewed. No pertinent surgical history.   Social History:   reports that she has never smoked. She has never used smokeless tobacco. She reports current alcohol use. She reports that she does not use drugs.   Family History:  Her family history is not on file.   Allergies Allergies  Allergen Reactions   Coconut (Cocos Nucifera) Allergy Skin Test Other (See Comments)   Justicia Adhatoda (Malabar Nut Tree) [Justicia Adhatoda] Other (See Comments)    Tree nuts   Shellfish Allergy Other (See Comments)     Home Medications  Prior to Admission medications   Medication Sig Start Date End Date Taking? Authorizing Provider  albuterol (VENTOLIN HFA) 108 (90 Base) MCG/ACT inhaler Inhale 2 puffs into the lungs every 6 (six) hours  as needed for wheezing.   Yes [provider]  montelukast (SINGULAIR) 10 MG tablet Take 1 tablet (10 mg total) by mouth at bedtime. 07/19/20  Yes Glade Lloyd, MD  Vitamin D, Ergocalciferol, (DRISDOL) 1.25 MG (50000 UNIT) CAPS capsule Take 50,000 Units by mouth once a week. 08/05/21  Yes [provider]  mometasone-formoterol (DULERA) 200-5 MCG/ACT AERO Inhale 2 puffs into the lungs  2 (two) times daily. 07/19/20   Glade Lloyd, MD    The patient is critically ill with multiple organ systems failure and requires high complexity decision making for assessment and support, frequent evaluation and titration of therapies, application of advanced monitoring technologies and extensive interpretation of multiple databases. Critical Care Time devoted to patient care services described in this note independent of APP/resident time (if applicable)  is 30 minutes.   Virl Diamond MD [place] [facility] Personal pager: See Amion If unanswered, please page CCM On-call: #[phone]

## 2021-08-13 LAB — GLUCOSE, CAPILLARY
Glucose-Capillary: 166 mg/dL — ABNORMAL HIGH (ref 70–99)
Glucose-Capillary: 158 mg/dL — ABNORMAL HIGH (ref 70–99)
Glucose-Capillary: 121 mg/dL — ABNORMAL HIGH (ref 70–99)
Glucose-Capillary: 147 mg/dL — ABNORMAL HIGH (ref 70–99)
Glucose-Capillary: 105 mg/dL — ABNORMAL HIGH (ref 70–99)

## 2021-08-13 LAB — PHOSPHORUS
Phosphorus: 1.5 mg/dL — ABNORMAL LOW (ref 2.5–4.6)
Phosphorus: 3.1 mg/dL (ref 2.5–4.6)

## 2021-08-13 LAB — MAGNESIUM
Magnesium: 2.3 mg/dL (ref 1.7–2.4)
Magnesium: 2.3 mg/dL (ref 1.7–2.4)

## 2021-08-13 MED ORDER — GUAIFENESIN-DM 100-10 MG/5ML PO SYRP
5.0000 mL | ORAL_SOLUTION | ORAL | Status: DC | PRN
  Administered 2021-08-13 – 2021-08-16 (×4): 5 mL via ORAL
  Filled 2021-08-13: qty 5
  Filled 2021-08-13 (×2): qty 10
  Filled 2021-08-13: qty 5
  Filled 2021-08-13: qty 10

## 2021-08-13 MED ORDER — PHENOL 1.4 % MT LIQD
1.0000 | OROMUCOSAL | Status: DC | PRN
  Filled 2021-08-13 (×2): qty 177

## 2021-08-13 MED ORDER — POLYETHYLENE GLYCOL 3350 17 G PO PACK: 17.0000 g | PACK | Freq: Every day | ORAL | Status: DC

## 2021-08-13 MED ORDER — SODIUM CHLORIDE 0.9 % IV BOLUS
1000.0000 mL | Freq: Once | INTRAVENOUS | Status: AC
  Administered 2021-08-13: 1000 mL via INTRAVENOUS

## 2021-08-13 MED ORDER — LORAZEPAM 2 MG/ML IJ SOLN
0.5000 mg | Freq: Once | INTRAMUSCULAR | Status: AC
  Administered 2021-08-13: 11:00:00 0.5 mg via INTRAVENOUS
  Filled 2021-08-13: qty 1

## 2021-08-13 MED ORDER — METHYLPREDNISOLONE SODIUM SUCC 125 MG IJ SOLR
80.0000 mg | Freq: Once | INTRAMUSCULAR | Status: AC
  Administered 2021-08-13: 11:00:00 80 mg via INTRAVENOUS
  Filled 2021-08-13: qty 2

## 2021-08-13 MED ORDER — ONDANSETRON HCL 4 MG/2ML IJ SOLN: 4.0000 mg | Freq: Four times a day (QID) | INTRAMUSCULAR | Status: DC | PRN

## 2021-08-13 MED ORDER — DOCUSATE SODIUM 50 MG/5ML PO LIQD: 100.0000 mg | Freq: Two times a day (BID) | ORAL | Status: DC | PRN

## 2021-08-13 MED ORDER — LIP MEDEX EX OINT
1.0000 "application " | TOPICAL_OINTMENT | CUTANEOUS | Status: DC | PRN
  Filled 2021-08-13: qty 7

## 2021-08-13 MED ORDER — DOCUSATE SODIUM 50 MG/5ML PO LIQD: 100.0000 mg | Freq: Two times a day (BID) | ORAL | Status: DC

## 2021-08-13 MED ORDER — POLYETHYLENE GLYCOL 3350 17 G PO PACK: 17.0000 g | PACK | Freq: Every day | ORAL | Status: DC | PRN

## 2021-08-13 MED ORDER — MONTELUKAST SODIUM 10 MG PO TABS
10.0000 mg | ORAL_TABLET | Freq: Every day | ORAL | Status: DC
  Administered 2021-08-13 – 2021-08-15 (×3): 10 mg via ORAL
  Filled 2021-08-13 (×3): qty 1

## 2021-08-13 MED ORDER — ORAL CARE MOUTH RINSE
15.0000 mL | Freq: Two times a day (BID) | OROMUCOSAL | Status: DC
  Administered 2021-08-13 – 2021-08-16 (×5): 15 mL via OROMUCOSAL

## 2021-08-13 MED ORDER — ACETAMINOPHEN 325 MG PO TABS: 650.0000 mg | ORAL_TABLET | Freq: Four times a day (QID) | ORAL | Status: DC | PRN

## 2021-08-13 NOTE — Progress Notes (Signed)
eLink Physician-Brief Progress Note Patient Name: Mary-Ann Pennella DOB: 1996/07/28 MRN: [id number]   Date of Service  08/13/2021  HPI/Events of Note  Sinus Tachycardia - HR = 134. BP - 111/51 with MAP = 65. No CVL or CVP. LVEF = 65-70%.  eICU Interventions  Plan: Bolus with 0.9 NaCl 1 liter IV over 1 hour now.      Intervention Category Major Interventions: Other:;Arrhythmia - evaluation and management  Keyshon Stein Eugene 08/13/2021, 12:08 AM

## 2021-08-13 NOTE — Progress Notes (Signed)
eLink Physician-Brief Progress Note Patient Name: Brenda Acevedo DOB: [birth year] MRN: [id number]   Date of Service  08/13/2021  HPI/Events of Note  I was asked to assess 7M 03 patient for potential transfer to Progressive Care Unit if bed is needed for incoming ICU admission.  eICU Interventions  Patient can be transferred if bed absolutely needed. I reviewed the chart and interviewed the patient via camera.        [name] [name] 08/13/2021, 11:10 PM

## 2021-08-13 NOTE — Progress Notes (Signed)
eLink Physician-Brief Progress Note Patient Name: Brenda Acevedo DOB: [birth year] MRN: [id number]   Date of Service  08/13/2021  HPI/Events of Note  Patient c/o nausea. QTc interval = 0.30 seconds.   eICU Interventions  Plan: Zofran 4 mg IV Q 6 hours PRN N/V.     Intervention Category Major Interventions: Other:  Lenell Antu 08/13/2021, 5:14 AM

## 2021-08-13 NOTE — Procedures (Signed)
Extubation Procedure Note  Patient Details:   Name: Brenda Acevedo DOB: 1996/02/11 MRN: [id number]   Airway Documentation:    Vent end date: 08/13/21 Vent end time: 1025   Evaluation  O2 sats: stable throughout Complications: No apparent complications Patient did tolerate procedure well. Bilateral Breath Sounds: Clear, Diminished, Expiratory wheezes   Yes  Pt extubated per physician order. Pt suctioned orally and via ETT prior. Minimal to no cuff leak noted. Dr. Wynona Neat notified and at pt bedside. Pt extubated to 4L nasal cannula. Pt tolerated well. PT with good cough, able to speak name and no stridor heard at this time. PRN albuterol tx given per MD request.RT will continue to monitor and be available as needed.   Derinda Late 08/13/2021, 10:37 AM

## 2021-08-13 NOTE — Progress Notes (Signed)
NAME:  Brenda Acevedo, MRN:  [id number], DOB:  1995-11-04, LOS: 2 ADMISSION DATE:  08/11/2021, CONSULTATION DATE:  08/11/2021 REFERRING MD:  Evelena Leyden CHIEF COMPLAINT:  Shortness of breath   History of Present Illness:  25 year old female with history of asthma was brought in the emergency department with a complaint of shortness of breath for few days. Patient received IV Solu-Medrol, magnesium and continuous nebs, without improvement, she was brought in the emergency department where she was put on BiPAP with some improvement in her work of breathing but transition from BiPAP to nasal cannula oxygen led to increasing shortness of breath and chest pressure.  Her initial ABG was 7.5 and PCO2 21, but she started getting tired out, repeat ABG showed PCO2 55 with pH 7.25, and patient is started complaining of being tired and not able to breathe. Patient denies fever, chills, nausea, vomiting, dysuria, urgency, frequency or other complaints   Pertinent  Medical History  Asthma  Significant Hospital Events: Including procedures, antibiotic start and stop dates in addition to other pertinent events   Bronchospastic overnight 12/28 sinus tachycardia overnight, did receive fluids  Interim History / Subjective:  Arousable Sedation being weaned Will attempt vent weaning Awake and interactive, denies specific complaints Having coughing episodes  Objective   Blood pressure (!) 89/72, pulse (!) 128, temperature 98 F (36.7 C), temperature source Oral, resp. rate (!) 23, height 5\' 2"  (1.575 m), weight 86.1 kg, SpO2 99 %.    Vent Mode: PRVC FiO2 (%):  [40 %] 40 % Set Rate:  [22 bmp-24 bmp] 22 bmp Vt Set:  [500 mL] 500 mL PEEP:  [5 cmH20] 5 cmH20 Plateau Pressure:  [17 cmH20-21 cmH20] 17 cmH20   Intake/Output Summary (Last 24 hours) at 08/13/2021 0741 Last data filed at 08/13/2021 0700 Gross per 24 hour  Intake 1934.51 ml  Output 1180 ml  Net 754.51 ml   Filed Weights   08/11/21  2200 08/12/21 0255 08/13/21 0500  Weight: 83.4 kg 83.4 kg 86.1 kg    Examination: General: Does not look acutely ill, alert and interactive HENT: Moist oral mucosa, endotracheal tube in place Lungs: Mild wheezing, significantly improved Cardiovascular: S1-S2 appreciated Abdomen: Bowel sounds appreciated Extremities: No clubbing, no edema Neuro: sedated GU: Fair output  Resolved Hospital Problem list     Assessment & Plan:   Acute severe asthma exacerbation Acute hypoxemic respiratory failure -Bronchodilators-albuterol -Continue steroids, Solu-Medrol 40 twice a day  Acute hypoxemic respiratory failure -Continue full vent support, driving pressure less than 15, lung protective strategy -Weaning as tolerated today -Possibly may be able to wean to extubation today  Elevated troponin -Secondary to demand ischemia -We will continue to monitor closely  Aggressive weaning measures today to see whether we are able to extubate  Successfully extubated 08/13/2021 at about 11  Best Practice (right click and "Reselect all SmartList Selections" daily)   Diet/type: tubefeeds, will hold for possible extubation DVT prophylaxis: LMWH GI prophylaxis: H2B Lines: N/A Foley:  N/A Code Status:  full code Last date of multidisciplinary goals of care discussion [pending]  Labs   CBC: Recent Labs  Lab 08/11/21 1051 08/11/21 1111 08/11/21 1723 08/11/21 2322 08/12/21 0212 08/12/21 0320 08/12/21 2019  WBC 20.0*  --   --   --   --  12.3*  --   NEUTROABS 18.4*  --   --   --   --   --   --   HGB 12.8   < > 15.3* 14.3 13.3 12.2  11.2*  HCT 41.8   < > 45.0 42.0 39.0 39.9 33.0*  MCV 78.4*  --   --   --   --  79.0*  --   PLT 427*  --   --   --   --  422*  --    < > = values in this interval not displayed.    Basic Metabolic Panel: Recent Labs  Lab 08/11/21 1051 08/11/21 1111 08/11/21 1723 08/11/21 2322 08/12/21 0212 08/12/21 0320 08/12/21 1618 08/12/21 2019  NA 136   < > 137  131* 135 136  --  135  K 3.6   < > 5.3* 7.5* 4.3 4.5  --  4.5  CL 102  --   --   --   --  103  --   --   CO2 23  --   --   --   --  17*  --   --   GLUCOSE 210*  --   --   --   --  232*  --   --   BUN 7  --   --   --   --  17  --   --   CREATININE 1.00  --   --   --   --  2.09*  --   --   CALCIUM 8.3*  --   --   --   --  8.8*  --   --   MG  --   --   --   --   --  3.0* 2.3  --   PHOS  --   --   --   --   --  4.0 3.0  --    < > = values in this interval not displayed.   GFR: Estimated Creatinine Clearance: 41.5 mL/min (A) (by C-G formula based on SCr of 2.09 mg/dL (H)). Recent Labs  Lab 08/11/21 1051 08/12/21 0320  WBC 20.0* 12.3*    Liver Function Tests: Recent Labs  Lab 08/11/21 1051  AST 24  ALT 33  ALKPHOS 96  BILITOT <0.1*  PROT 7.0  ALBUMIN 3.7   No results for input(s): LIPASE, AMYLASE in the last 168 hours. No results for input(s): AMMONIA in the last 168 hours.  ABG    Component Value Date/Time   PHART 7.435 08/12/2021 2019   PCO2ART 34.3 08/12/2021 2019   PO2ART 116 (H) 08/12/2021 2019   HCO3 23.1 08/12/2021 2019   TCO2 24 08/12/2021 2019   ACIDBASEDEF 1.0 08/12/2021 2019   O2SAT 99.0 08/12/2021 2019     Coagulation Profile: No results for input(s): INR, PROTIME in the last 168 hours.  Cardiac Enzymes: No results for input(s): CKTOTAL, CKMB, CKMBINDEX, TROPONINI in the last 168 hours.  HbA1C: Hgb A1c MFr Bld  Date/Time Value Ref Range Status  08/12/2021 03:30 AM 6.1 (H) 4.8 - 5.6 % Final    Comment:    (NOTE) Pre diabetes:          5.7%-6.4%  Diabetes:              >6.4%  Glycemic control for   <7.0% adults with diabetes     CBG: Recent Labs  Lab 08/12/21 1517 08/12/21 1911 08/12/21 2307 08/13/21 0304 08/13/21 0718  GLUCAP 132* 139* 139* 166* 158*    Review of Systems:   Unobtainable at present  Past Medical History:  She,  has a past medical history of Asthma.   Surgical History:  History  reviewed. No pertinent surgical  history.   Social History:   reports that she has never smoked. She has never used smokeless tobacco. She reports current alcohol use. She reports that she does not use drugs.   Family History:  Her family history is not on file.   Allergies Allergies  Allergen Reactions   Coconut (Cocos Nucifera) Allergy Skin Test Other (See Comments)   Justicia Adhatoda (Malabar Nut Tree) [Justicia Adhatoda] Other (See Comments)    Tree nuts   Shellfish Allergy Other (See Comments)    The patient is critically ill with multiple organ systems failure and requires high complexity decision making for assessment and support, frequent evaluation and titration of therapies, application of advanced monitoring technologies and extensive interpretation of multiple databases. Critical Care Time devoted to patient care services described in this note independent of APP/resident time (if applicable)  is 31 minutes.   Virl Diamond MD [facility] Personal pager: See Amion If unanswered, please page CCM On-call: #[phone]

## 2021-08-13 NOTE — Progress Notes (Signed)
°  Progress Note   Date: 08/13/2021  Patient Name: Brenda Acevedo        MRN#: [id number]  Review the patients clinical findings supports the diagnosis of:   AKI Elevated creatinine, double patient's baseline

## 2021-08-14 LAB — BASIC METABOLIC PANEL
Anion gap: 10 (ref 5–15)
BUN: 17 mg/dL (ref 6–20)
CO2: 24 mmol/L (ref 22–32)
Calcium: 9.1 mg/dL (ref 8.9–10.3)
Chloride: 103 mmol/L (ref 98–111)
Creatinine, Ser: 0.82 mg/dL (ref 0.44–1.00)
GFR, Estimated: >60 mL/min (ref >60)
Glucose, Bld: 139 mg/dL — ABNORMAL HIGH (ref 70–99)
Potassium: 4.4 mmol/L (ref 3.5–5.1)
Sodium: 137 mmol/L (ref 135–145)

## 2021-08-14 LAB — CBC
HCT: 37.7 % (ref 36.0–46.0)
Hemoglobin: 11.8 g/dL — ABNORMAL LOW (ref 12.0–15.0)
MCH: 23.9 pg — ABNORMAL LOW (ref 26.0–34.0)
MCHC: 31.3 g/dL (ref 30.0–36.0)
MCV: 76.3 fL — ABNORMAL LOW (ref 80.0–100.0)
Platelets: 430 10*3/uL — ABNORMAL HIGH (ref 150–400)
RBC: 4.94 MIL/uL (ref 3.87–5.11)
RDW: 18.1 % — ABNORMAL HIGH (ref 11.5–15.5)
WBC: 17.7 10*3/uL — ABNORMAL HIGH (ref 4.0–10.5)
nRBC: 0 % (ref 0.0–0.2)

## 2021-08-14 MED ORDER — METHYLPREDNISOLONE SODIUM SUCC 125 MG IJ SOLR
60.0000 mg | Freq: Two times a day (BID) | INTRAMUSCULAR | Status: DC
  Administered 2021-08-14 – 2021-08-15 (×2): 60 mg via INTRAVENOUS
  Filled 2021-08-14 (×2): qty 2

## 2021-08-14 NOTE — Progress Notes (Signed)
Mobility Specialist Progress Note   08/14/21 1800  Oxygen Therapy  SpO2 99 %  O2 Device Nasal Cannula  O2 Flow Rate (L/min) 2 L/min  Mobility  Activity Ambulated in hall  Level of Assistance Standby assist, set-up cues, supervision of patient - no hands on  Assistive Device None (O2 tank)  Distance Ambulated (ft) 260 ft (115+145)  Mobility Ambulated independently in hallway  Mobility Response Tolerated well  Mobility performed by Mobility specialist  $Mobility charge 1 Mobility   Received pt in bed c/o congestion but agreeable. X1 seated break d/t tightness in chest but resolves after ~3mins of pursed lip breathing. Returned back to bed w/ no complaints and call bell in reach.   Pre Mobility: 122 HR, 99% SpO2 on 2LO2 During Mobility: 133 HR, 98% SpO2 on 2LO2 Post Mobility: 128 HR, 97% SpO2 on 2LO2  Brenda Acevedo Mobility Specialist Phone Number [phone]

## 2021-08-14 NOTE — Progress Notes (Signed)
PROGRESS NOTE    Brenda Acevedo  FOY:[id number] DOB: [birth year] DOA: 08/11/2021 PCP: Patient, No Pcp Per (Inactive)  Brief Narrative: 25/F presented to the ED with respiratory distress and wheezing secondary to asthma exacerbation, she developed worsening respiratory failure and was admitted to the ICU, eventually required mechanical ventilation, extubated 12/28   Assessment & Plan:   Principal Problem:   Acute asthma exacerbation   Acute respiratory failure with hypercapnia (HCC) -Required mechanical ventilation, extubated 12/28 -Respiratory virus panel positive for rhinovirus -Continues to be wheezing today, continue IV steroids will decrease dose, continue duo nebs -Resume Dulera at discharge, will need pulmonary follow-up as well  Acute kidney injury -Etiology unclear, will repeat  Obesity -Recommended lifestyle modification  Hyperglycemia secondary to steroids -Recommend checking hemoglobin A1c in 1 to 2 months  DVT prophylaxis: Lovenox Code Status: Full code Family Communication: No family at bedside Disposition Plan: Home in 1 to 2 days Status is: Inpatient  Remains inpatient appropriate because: Severity of illness        Consultants:  PCCM transfer  Procedures:   Antimicrobials:    Subjective: -Feeling better, breathing is improving, still wheezing  Objective: Vitals:   08/14/21 0800 08/14/21 0801 08/14/21 1107 08/14/21 1148  BP:    132/86  Pulse:    (!) 108  Resp:    20  Temp:    97.8 F (36.6 C)  TempSrc:    Oral  SpO2: 98% 98% 97% 97%  Weight:      Height:        Intake/Output Summary (Last 24 hours) at 08/14/2021 1231 Last data filed at 08/14/2021 1229 Gross per 24 hour  Intake 1020 ml  Output --  Net 1020 ml   Filed Weights   08/11/21 2200 08/12/21 0255 08/13/21 0500  Weight: 83.4 kg 83.4 kg 86.1 kg    Examination:  General exam: Appears calm and comfortable  Respiratory system: Improving air movement, bilateral  expiratory wheezes Cardiovascular system: S1 & S2 heard, RRR.  Abd: nondistended, soft and nontender.Normal bowel sounds heard. Central nervous system: Alert and oriented. No focal neurological deficits. Extremities: no edema Skin: No rashes Psychiatry: Judgement and insight appear normal. Mood & affect appropriate.     Data Reviewed:   CBC: Recent Labs  Lab 08/11/21 1051 08/11/21 1111 08/11/21 2322 08/12/21 0212 08/12/21 0320 08/12/21 2019 08/14/21 1052  WBC 20.0*  --   --   --  12.3*  --  17.7*  NEUTROABS 18.4*  --   --   --   --   --   --   HGB 12.8   < > 14.3 13.3 12.2 11.2* 11.8*  HCT 41.8   < > 42.0 39.0 39.9 33.0* 37.7  MCV 78.4*  --   --   --  79.0*  --  76.3*  PLT 427*  --   --   --  422*  --  430*   < > = values in this interval not displayed.   Basic Metabolic Panel: Recent Labs  Lab 08/11/21 1051 08/11/21 1111 08/11/21 2322 08/12/21 0212 08/12/21 0320 08/12/21 1618 08/12/21 2019 08/13/21 0616 08/13/21 1714 08/14/21 1052  NA 136   < > 131* 135 136  --  135  --   --  137  K 3.6   < > 7.5* 4.3 4.5  --  4.5  --   --  4.4  CL 102  --   --   --  103  --   --   --   --  103  CO2 23  --   --   --  17*  --   --   --   --  24  GLUCOSE 210*  --   --   --  232*  --   --   --   --  139*  BUN 7  --   --   --  17  --   --   --   --  17  CREATININE 1.00  --   --   --  2.09*  --   --   --   --  0.82  CALCIUM 8.3*  --   --   --  8.8*  --   --   --   --  9.1  MG  --   --   --   --  3.0* 2.3  --  2.3 2.3  --   PHOS  --   --   --   --  4.0 3.0  --  1.5* 3.1  --    < > = values in this interval not displayed.   GFR: Estimated Creatinine Clearance: 105.9 mL/min (by C-G formula based on SCr of 0.82 mg/dL). Liver Function Tests: Recent Labs  Lab 08/11/21 1051  AST 24  ALT 33  ALKPHOS 96  BILITOT <0.1*  PROT 7.0  ALBUMIN 3.7   No results for input(s): LIPASE, AMYLASE in the last 168 hours. No results for input(s): AMMONIA in the last 168 hours. Coagulation  Profile: No results for input(s): INR, PROTIME in the last 168 hours. Cardiac Enzymes: No results for input(s): CKTOTAL, CKMB, CKMBINDEX, TROPONINI in the last 168 hours. BNP (last 3 results) No results for input(s): PROBNP in the last 8760 hours. HbA1C: Recent Labs    08/12/21 0330  HGBA1C 6.1*   CBG: Recent Labs  Lab 08/13/21 0304 08/13/21 0718 08/13/21 1101 08/13/21 1541 08/13/21 2306  GLUCAP 166* 158* 121* 147* 105*   Lipid Profile: Recent Labs    08/12/21 0320  TRIG 168*   Thyroid Function Tests: No results for input(s): TSH, T4TOTAL, FREET4, T3FREE, THYROIDAB in the last 72 hours. Anemia Panel: No results for input(s): VITAMINB12, FOLATE, FERRITIN, TIBC, IRON, RETICCTPCT in the last 72 hours. Urine analysis:    Component Value Date/Time   COLORURINE YELLOW 08/27/2019 1722   APPEARANCEUR CLEAR 08/27/2019 1722   LABSPEC 1.019 08/27/2019 1722   PHURINE 6.0 08/27/2019 1722   GLUCOSEU NEGATIVE 08/27/2019 1722   HGBUR LARGE (A) 08/27/2019 1722   BILIRUBINUR NEGATIVE 08/27/2019 1722   KETONESUR NEGATIVE 08/27/2019 1722   PROTEINUR NEGATIVE 08/27/2019 1722   NITRITE NEGATIVE 08/27/2019 1722   LEUKOCYTESUR TRACE (A) 08/27/2019 1722   Sepsis Labs: @LABRCNTIP (procalcitonin:4,lacticidven:4)  ) Recent Results (from the past 240 hour(s))  Resp Panel by RT-PCR (Flu A&B, Covid) Nasopharyngeal Swab     Status: None   Collection Time: 08/11/21 10:51 AM   Specimen: Nasopharyngeal Swab; Nasopharyngeal(NP) swabs in vial transport medium  Result Value Ref Range Status   SARS Coronavirus 2 by RT PCR NEGATIVE NEGATIVE Final    Comment: (NOTE) SARS-CoV-2 target nucleic acids are NOT DETECTED.  The SARS-CoV-2 RNA is generally detectable in upper respiratory specimens during the acute phase of infection. The lowest concentration of SARS-CoV-2 viral copies this assay can detect is 138 copies/mL. A negative result does not preclude SARS-Cov-2 infection and should not be used  as the sole basis for treatment or other patient management decisions. A negative result may occur with  improper specimen collection/handling, submission of specimen other than nasopharyngeal swab, presence of viral mutation(s) within the areas targeted by this assay, and inadequate number of viral copies(<138 copies/mL). A negative result must be combined with clinical observations, patient history, and epidemiological information. The expected result is Negative.  Fact Sheet for Patients:  BloggerCourse.com  Fact Sheet for Healthcare Providers:  SeriousBroker.it  This test is no t yet approved or cleared by the [place] FDA and  has been authorized for detection and/or diagnosis of SARS-CoV-2 by FDA under an Emergency Use Authorization (EUA). This EUA will remain  in effect (meaning this test can be used) for the duration of the COVID-19 declaration under Section 564(b)(1) of the Act, 21 U.S.C.section 360bbb-3(b)(1), unless the authorization is terminated  or revoked sooner.       Influenza A by PCR NEGATIVE NEGATIVE Final   Influenza B by PCR NEGATIVE NEGATIVE Final    Comment: (NOTE) The Xpert Xpress SARS-CoV-2/FLU/RSV plus assay is intended as an aid in the diagnosis of influenza from Nasopharyngeal swab specimens and should not be used as a sole basis for treatment. Nasal washings and aspirates are unacceptable for Xpert Xpress SARS-CoV-2/FLU/RSV testing.  Fact Sheet for Patients: BloggerCourse.com  Fact Sheet for Healthcare Providers: SeriousBroker.it  This test is not yet approved or cleared by the [place] FDA and has been authorized for detection and/or diagnosis of SARS-CoV-2 by FDA under an Emergency Use Authorization (EUA). This EUA will remain in effect (meaning this test can be used) for the duration of the COVID-19 declaration under Section 564(b)(1)  of the Act, 21 U.S.C. section 360bbb-3(b)(1), unless the authorization is terminated or revoked.  Performed at [facility], [address], [place], [place] [address]   Respiratory (~20 pathogens) panel by PCR     Status: Abnormal   Collection Time: 08/11/21 10:51 AM   Specimen: Nasopharyngeal Swab; Respiratory  Result Value Ref Range Status   Adenovirus NOT DETECTED NOT DETECTED Final   Coronavirus 229E NOT DETECTED NOT DETECTED Final    Comment: (NOTE) The Coronavirus on the Respiratory Panel, DOES NOT test for the novel  Coronavirus (2019 nCoV)    Coronavirus HKU1 NOT DETECTED NOT DETECTED Final   Coronavirus NL63 NOT DETECTED NOT DETECTED Final   Coronavirus OC43 NOT DETECTED NOT DETECTED Final   Metapneumovirus NOT DETECTED NOT DETECTED Final   Rhinovirus / Enterovirus DETECTED (A) NOT DETECTED Final   Influenza A NOT DETECTED NOT DETECTED Final   Influenza B NOT DETECTED NOT DETECTED Final   Parainfluenza Virus 1 NOT DETECTED NOT DETECTED Final   Parainfluenza Virus 2 NOT DETECTED NOT DETECTED Final   Parainfluenza Virus 3 NOT DETECTED NOT DETECTED Final   Parainfluenza Virus 4 NOT DETECTED NOT DETECTED Final   Respiratory Syncytial Virus NOT DETECTED NOT DETECTED Final   Bordetella pertussis NOT DETECTED NOT DETECTED Final   Bordetella Parapertussis NOT DETECTED NOT DETECTED Final   Chlamydophila pneumoniae NOT DETECTED NOT DETECTED Final   Mycoplasma pneumoniae NOT DETECTED NOT DETECTED Final    Comment: Performed at [facility], [address], [place], [place] [address]  MRSA Next Gen by PCR, Nasal     Status: None   Collection Time: 08/11/21 10:23 PM   Specimen: Nasal Mucosa; Nasal Swab  Result Value Ref Range Status   MRSA by PCR Next Gen NOT DETECTED NOT DETECTED Final    Comment: (NOTE) The GeneXpert MRSA Assay (FDA approved for NASAL specimens only), is one component  of a comprehensive MRSA colonization surveillance program. It is not intended to  diagnose MRSA infection nor to guide or monitor treatment for MRSA infections. Test performance is not FDA approved in patients less than 50 years old. Performed at [facility], [address] [place], [place] [address]          Radiology Studies: No results found.      Scheduled Meds:  Chlorhexidine Gluconate Cloth  6 each Topical Daily   enoxaparin (LOVENOX) injection  40 mg Subcutaneous Q24H   ipratropium-albuterol  3 mL Nebulization Q4H   mouth rinse  15 mL Mouth Rinse BID   methylPREDNISolone (SOLU-MEDROL) injection  60 mg Intravenous Q12H   mometasone-formoterol  2 puff Inhalation BID   montelukast  10 mg Oral QHS   Vitamin D (Ergocalciferol)  50,000 Units Oral Weekly   Continuous Infusions:  sodium chloride     albuterol 10 mg/hr (08/11/21 1042)     LOS: 3 days    Time spent:    Zannie Cove, MD [facility]   08/14/2021, 12:31 PM

## 2021-08-15 LAB — BASIC METABOLIC PANEL
Anion gap: 7 (ref 5–15)
BUN: 18 mg/dL (ref 6–20)
CO2: 22 mmol/L (ref 22–32)
Calcium: 8.7 mg/dL — ABNORMAL LOW (ref 8.9–10.3)
Chloride: 105 mmol/L (ref 98–111)
Creatinine, Ser: 0.79 mg/dL (ref 0.44–1.00)
GFR, Estimated: >60 mL/min (ref >60)
Glucose, Bld: 147 mg/dL — ABNORMAL HIGH (ref 70–99)
Potassium: 4.4 mmol/L (ref 3.5–5.1)
Sodium: 134 mmol/L — ABNORMAL LOW (ref 135–145)

## 2021-08-15 LAB — CBC
HCT: 39 % (ref 36.0–46.0)
Hemoglobin: 12.3 g/dL (ref 12.0–15.0)
MCH: 24 pg — ABNORMAL LOW (ref 26.0–34.0)
MCHC: 31.5 g/dL (ref 30.0–36.0)
MCV: 76.2 fL — ABNORMAL LOW (ref 80.0–100.0)
Platelets: 437 10*3/uL — ABNORMAL HIGH (ref 150–400)
RBC: 5.12 MIL/uL — ABNORMAL HIGH (ref 3.87–5.11)
RDW: 17.8 % — ABNORMAL HIGH (ref 11.5–15.5)
WBC: 13.9 10*3/uL — ABNORMAL HIGH (ref 4.0–10.5)
nRBC: 0 % (ref 0.0–0.2)

## 2021-08-15 MED ORDER — PREDNISONE 20 MG PO TABS
40.0000 mg | ORAL_TABLET | Freq: Every day | ORAL | Status: DC
  Administered 2021-08-16: 40 mg via ORAL
  Filled 2021-08-15: qty 2

## 2021-08-15 MED ORDER — IPRATROPIUM-ALBUTEROL 0.5-2.5 (3) MG/3ML IN SOLN
3.0000 mL | Freq: Four times a day (QID) | RESPIRATORY_TRACT | Status: DC
Start: 2021-08-15 — End: 2021-08-16
  Administered 2021-08-15 – 2021-08-16 (×6): 3 mL via RESPIRATORY_TRACT
  Filled 2021-08-15 (×7): qty 3

## 2021-08-15 MED ORDER — METHYLPREDNISOLONE SODIUM SUCC 40 MG IJ SOLR
40.0000 mg | Freq: Two times a day (BID) | INTRAMUSCULAR | Status: AC
  Administered 2021-08-15: 22:00:00 40 mg via INTRAVENOUS
  Filled 2021-08-15: qty 1

## 2021-08-15 NOTE — Progress Notes (Signed)
Mobility Specialist Progress Note   08/15/21 1830  Mobility  Activity Ambulated in hall  Level of Assistance Standby assist, set-up cues, supervision of patient - no hands on  Assistive Device  (O2 Tank)  Distance Ambulated (ft) 175 ft  Mobility Ambulated with assistance in hallway  Mobility Response Tolerated fair  Mobility performed by Mobility specialist  $Mobility charge 1 Mobility   Received pt having no complaints and agreeable. Originally ambulated on RA until pt desat to 86% SpO2 for . Per advisement of RN 1LO2 was placed on pt. Pt resat to 99% SpO2. Session cut short d/t an elevated HR of 145. Returned back to bed where pt practiced pursed lip breathing and call bell was put in reach.   Frederico Hamman Mobility Specialist Phone Number [phone]

## 2021-08-15 NOTE — Plan of Care (Signed)

## 2021-08-15 NOTE — Progress Notes (Signed)
PROGRESS NOTE    Brenda Acevedo  EHU:[id number] DOB: May 27, 1996 DOA: 08/11/2021 PCP: Patient, No Pcp Per (Inactive)  Brief Narrative: 25/F presented to the ED with respiratory distress and wheezing secondary to asthma exacerbation, she developed worsening respiratory failure and was admitted to the ICU, eventually required mechanical ventilation, extubated 12/28   Assessment & Plan:     Acute asthma exacerbation   Acute respiratory failure with hypercapnia (HCC) -Required mechanical ventilation, extubated 12/28 -Respiratory virus panel positive for rhinovirus -Slowly improving, still with minimal to no air movement, wean off O2 today , transition to prednisone tomorrow, continue duo nebs -Resume Dulera at discharge, will need pulmonary follow-up as well  Acute kidney injury -Resolved  Obesity -Recommended lifestyle modification  Hyperglycemia secondary to steroids -Recommend checking hemoglobin A1c in 1 to 2 months  DVT prophylaxis: Lovenox Code Status: Full code Family Communication: No family at bedside Disposition Plan: Home in 1 to 2 days Status is: Inpatient  Remains inpatient appropriate because: Severity of illness        Consultants:  PCCM transfer  Procedures:   Antimicrobials:    Subjective: -Feeling better overall, breathing is improving, ambulating  Objective: Vitals:   08/15/21 0033 08/15/21 0455 08/15/21 0722 08/15/21 0801  BP: 119/78 100/65    Pulse: (!) 103 87    Resp: 16 15    Temp: 98.9 F (37.2 C) 98.3 F (36.8 C) 97.9 F (36.6 C)   TempSrc: Oral Oral    SpO2: 97% 98%  97%  Weight:  85.6 kg    Height:        Intake/Output Summary (Last 24 hours) at 08/15/2021 1125 Last data filed at 08/15/2021 0900 Gross per 24 hour  Intake 840 ml  Output --  Net 840 ml   Filed Weights   08/12/21 0255 08/13/21 0500 08/15/21 0455  Weight: 83.4 kg 86.1 kg 85.6 kg    Examination:  Gen: Pleasant obese female awake, Alert, Oriented X 3,   HEENT: no JVD Lungs: Very poor air movement, no expiratory wheezes today CVS: S1S2/RRR Abd: soft, Non tender, non distended, BS present Extremities: No edema Skin: no new rashes on exposed skin Psychiatry: Judgement and insight appear normal. Mood & affect appropriate.     Data Reviewed:   CBC: Recent Labs  Lab 08/11/21 1051 08/11/21 1111 08/12/21 0212 08/12/21 0320 08/12/21 2019 08/14/21 1052 08/15/21 0250  WBC 20.0*  --   --  12.3*  --  17.7* 13.9*  NEUTROABS 18.4*  --   --   --   --   --   --   HGB 12.8   < > 13.3 12.2 11.2* 11.8* 12.3  HCT 41.8   < > 39.0 39.9 33.0* 37.7 39.0  MCV 78.4*  --   --  79.0*  --  76.3* 76.2*  PLT 427*  --   --  422*  --  430* 437*   < > = values in this interval not displayed.   Basic Metabolic Panel: Recent Labs  Lab 08/11/21 1051 08/11/21 1111 08/12/21 0212 08/12/21 0320 08/12/21 1618 08/12/21 2019 08/13/21 0616 08/13/21 1714 08/14/21 1052 08/15/21 0250  NA 136   < > 135 136  --  135  --   --  137 134*  K 3.6   < > 4.3 4.5  --  4.5  --   --  4.4 4.4  CL 102  --   --  103  --   --   --   --  103 105  CO2 23  --   --  17*  --   --   --   --  24 22  GLUCOSE 210*  --   --  232*  --   --   --   --  139* 147*  BUN 7  --   --  17  --   --   --   --  17 18  CREATININE 1.00  --   --  2.09*  --   --   --   --  0.82 0.79  CALCIUM 8.3*  --   --  8.8*  --   --   --   --  9.1 8.7*  MG  --   --   --  3.0* 2.3  --  2.3 2.3  --   --   PHOS  --   --   --  4.0 3.0  --  1.5* 3.1  --   --    < > = values in this interval not displayed.   GFR: Estimated Creatinine Clearance: 108.2 mL/min (by C-G formula based on SCr of 0.79 mg/dL). Liver Function Tests: Recent Labs  Lab 08/11/21 1051  AST 24  ALT 33  ALKPHOS 96  BILITOT <0.1*  PROT 7.0  ALBUMIN 3.7   No results for input(s): LIPASE, AMYLASE in the last 168 hours. No results for input(s): AMMONIA in the last 168 hours. Coagulation Profile: No results for input(s): INR, PROTIME in the  last 168 hours. Cardiac Enzymes: No results for input(s): CKTOTAL, CKMB, CKMBINDEX, TROPONINI in the last 168 hours. BNP (last 3 results) No results for input(s): PROBNP in the last 8760 hours. HbA1C: No results for input(s): HGBA1C in the last 72 hours.  CBG: Recent Labs  Lab 08/13/21 0304 08/13/21 0718 08/13/21 1101 08/13/21 1541 08/13/21 2306  GLUCAP 166* 158* 121* 147* 105*   Lipid Profile: No results for input(s): CHOL, HDL, LDLCALC, TRIG, CHOLHDL, LDLDIRECT in the last 72 hours.  Thyroid Function Tests: No results for input(s): TSH, T4TOTAL, FREET4, T3FREE, THYROIDAB in the last 72 hours. Anemia Panel: No results for input(s): VITAMINB12, FOLATE, FERRITIN, TIBC, IRON, RETICCTPCT in the last 72 hours. Urine analysis:    Component Value Date/Time   COLORURINE YELLOW 08/27/2019 1722   APPEARANCEUR CLEAR 08/27/2019 1722   LABSPEC 1.019 08/27/2019 1722   PHURINE 6.0 08/27/2019 1722   GLUCOSEU NEGATIVE 08/27/2019 1722   HGBUR LARGE (A) 08/27/2019 1722   BILIRUBINUR NEGATIVE 08/27/2019 1722   KETONESUR NEGATIVE 08/27/2019 1722   PROTEINUR NEGATIVE 08/27/2019 1722   NITRITE NEGATIVE 08/27/2019 1722   LEUKOCYTESUR TRACE (A) 08/27/2019 1722   Sepsis Labs: @LABRCNTIP (procalcitonin:4,lacticidven:4)  ) Recent Results (from the past 240 hour(s))  Resp Panel by RT-PCR (Flu A&B, Covid) Nasopharyngeal Swab     Status: None   Collection Time: 08/11/21 10:51 AM   Specimen: Nasopharyngeal Swab; Nasopharyngeal(NP) swabs in vial transport medium  Result Value Ref Range Status   SARS Coronavirus 2 by RT PCR NEGATIVE NEGATIVE Final    Comment: (NOTE) SARS-CoV-2 target nucleic acids are NOT DETECTED.  The SARS-CoV-2 RNA is generally detectable in upper respiratory specimens during the acute phase of infection. The lowest concentration of SARS-CoV-2 viral copies this assay can detect is 138 copies/mL. A negative result does not preclude SARS-Cov-2 infection and should not be used  as the sole basis for treatment or other patient management decisions. A negative result may occur with  improper specimen collection/handling, submission of  specimen other than nasopharyngeal swab, presence of viral mutation(s) within the areas targeted by this assay, and inadequate number of viral copies(<138 copies/mL). A negative result must be combined with clinical observations, patient history, and epidemiological information. The expected result is Negative.  Fact Sheet for Patients:  BloggerCourse.com  Fact Sheet for Healthcare Providers:  SeriousBroker.it  This test is no t yet approved or cleared by the [place] FDA and  has been authorized for detection and/or diagnosis of SARS-CoV-2 by FDA under an Emergency Use Authorization (EUA). This EUA will remain  in effect (meaning this test can be used) for the duration of the COVID-19 declaration under Section 564(b)(1) of the Act, 21 U.S.C.section 360bbb-3(b)(1), unless the authorization is terminated  or revoked sooner.       Influenza A by PCR NEGATIVE NEGATIVE Final   Influenza B by PCR NEGATIVE NEGATIVE Final    Comment: (NOTE) The Xpert Xpress SARS-CoV-2/FLU/RSV plus assay is intended as an aid in the diagnosis of influenza from Nasopharyngeal swab specimens and should not be used as a sole basis for treatment. Nasal washings and aspirates are unacceptable for Xpert Xpress SARS-CoV-2/FLU/RSV testing.  Fact Sheet for Patients: BloggerCourse.com  Fact Sheet for Healthcare Providers: SeriousBroker.it  This test is not yet approved or cleared by the [place] FDA and has been authorized for detection and/or diagnosis of SARS-CoV-2 by FDA under an Emergency Use Authorization (EUA). This EUA will remain in effect (meaning this test can be used) for the duration of the COVID-19 declaration under Section 564(b)(1)  of the Act, 21 U.S.C. section 360bbb-3(b)(1), unless the authorization is terminated or revoked.  Performed at [facility], [address], [place], [place] [address]   Respiratory (~20 pathogens) panel by PCR     Status: Abnormal   Collection Time: 08/11/21 10:51 AM   Specimen: Nasopharyngeal Swab; Respiratory  Result Value Ref Range Status   Adenovirus NOT DETECTED NOT DETECTED Final   Coronavirus 229E NOT DETECTED NOT DETECTED Final    Comment: (NOTE) The Coronavirus on the Respiratory Panel, DOES NOT test for the novel  Coronavirus (2019 nCoV)    Coronavirus HKU1 NOT DETECTED NOT DETECTED Final   Coronavirus NL63 NOT DETECTED NOT DETECTED Final   Coronavirus OC43 NOT DETECTED NOT DETECTED Final   Metapneumovirus NOT DETECTED NOT DETECTED Final   Rhinovirus / Enterovirus DETECTED (A) NOT DETECTED Final   Influenza A NOT DETECTED NOT DETECTED Final   Influenza B NOT DETECTED NOT DETECTED Final   Parainfluenza Virus 1 NOT DETECTED NOT DETECTED Final   Parainfluenza Virus 2 NOT DETECTED NOT DETECTED Final   Parainfluenza Virus 3 NOT DETECTED NOT DETECTED Final   Parainfluenza Virus 4 NOT DETECTED NOT DETECTED Final   Respiratory Syncytial Virus NOT DETECTED NOT DETECTED Final   Bordetella pertussis NOT DETECTED NOT DETECTED Final   Bordetella Parapertussis NOT DETECTED NOT DETECTED Final   Chlamydophila pneumoniae NOT DETECTED NOT DETECTED Final   Mycoplasma pneumoniae NOT DETECTED NOT DETECTED Final    Comment: Performed at [facility], [address], [place], [place] [address]  MRSA Next Gen by PCR, Nasal     Status: None   Collection Time: 08/11/21 10:23 PM   Specimen: Nasal Mucosa; Nasal Swab  Result Value Ref Range Status   MRSA by PCR Next Gen NOT DETECTED NOT DETECTED Final    Comment: (NOTE) The GeneXpert MRSA Assay (FDA approved for NASAL specimens only), is one component of a comprehensive MRSA colonization  surveillance program. It is not intended to  diagnose MRSA infection nor to guide or monitor treatment for MRSA infections. Test performance is not FDA approved in patients less than 77 years old. Performed at [facility], [address], [place], [place] [address]     Scheduled Meds:  Chlorhexidine Gluconate Cloth  6 each Topical Daily   enoxaparin (LOVENOX) injection  40 mg Subcutaneous Q24H   ipratropium-albuterol  3 mL Nebulization Q6H   mouth rinse  15 mL Mouth Rinse BID   methylPREDNISolone (SOLU-MEDROL) injection  60 mg Intravenous Q12H   mometasone-formoterol  2 puff Inhalation BID   montelukast  10 mg Oral QHS   Vitamin D (Ergocalciferol)  50,000 Units Oral Weekly   Continuous Infusions:  sodium chloride     albuterol 10 mg/hr (08/11/21 1042)     LOS: 4 days    Time spent:  Zannie Cove, MD [facility]   08/15/2021, 11:25 AM

## 2021-08-16 DIAGNOSIS — J9602 Acute respiratory failure with hypercapnia: Secondary | ICD-10-CM

## 2021-08-16 MED ORDER — BUDESONIDE-FORMOTEROL FUMARATE 160-4.5 MCG/ACT IN AERO: 2.0000 | INHALATION_SPRAY | Freq: Two times a day (BID) | RESPIRATORY_TRACT | 2 refills | Status: AC

## 2021-08-16 MED ORDER — ALBUTEROL SULFATE HFA 108 (90 BASE) MCG/ACT IN AERS: 2.0000 | INHALATION_SPRAY | Freq: Four times a day (QID) | RESPIRATORY_TRACT | 1 refills | Status: AC | PRN

## 2021-08-16 MED ORDER — PREDNISONE 20 MG PO TABS: 20.0000 mg | ORAL_TABLET | Freq: Every day | ORAL | 0 refills | Status: AC

## 2021-08-16 NOTE — Plan of Care (Signed)
  Problem: Activity: Goal: Risk for activity intolerance will decrease Outcome: Progressing   Problem: Nutrition: Goal: Adequate nutrition will be maintained Outcome: Progressing   Problem: Coping: Goal: Level of anxiety will decrease Outcome: Progressing   Problem: Elimination: Goal: Will not experience complications related to bowel motility Outcome: Progressing   Problem: Safety: Goal: Ability to remain free from injury will improve Outcome: Progressing   

## 2021-08-16 NOTE — Discharge Summary (Signed)
Physician Discharge Summary  Brenda Acevedo ZOX:[id number] DOB: [birth year] DOA: 08/11/2021  PCP: Patient, No Pcp Per (Inactive)  Admit date: 08/11/2021 Discharge date: 08/16/2021  Time spent: 35 minutes  Recommendations for Outpatient Follow-up:  PCP in 1 week Pulmonary referral sent Needs repeat hemoglobin A1c in 1 to 2 months   Discharge Diagnoses:  Principal Problem:   Acute asthma exacerbation Active Problems:   Acute respiratory failure with hypercapnia (HCC) Hyperglycemia secondary to steroids Obesity  Discharge Condition: Stable  Diet recommendation: Regular, low calorie diet  Filed Weights   08/13/21 0500 08/15/21 0455 08/16/21 0626  Weight: 86.1 kg 85.6 kg 85.6 kg    History of present illness:  25/F presented to the ED with respiratory distress and wheezing secondary to asthma exacerbation, she developed worsening respiratory failure and was admitted to the ICU, required mechanical ventilation, extubated 12/28    Hospital Course:    Acute asthma exacerbation   Acute respiratory failure with hypercapnia (HCC) -Required mechanical ventilation, extubated 12/28 -Respiratory virus panel positive for rhinovirus -Clinically improving, transition to prednisone taper, she was prescribed Dulera previously however was not able to afford this, so switched to Symbicort instead. -Provided prescription for albuterol MDI as well  -Pulmonary referral sent  -Clinically improved and stable weaned off oxygen, discharged home in a stable condition today    Acute kidney injury -Resolved   Obesity -Recommended lifestyle modification, weight loss   Hyperglycemia secondary to steroids -Recommend checking hemoglobin A1c in 1 to 2 months    Procedures: Mechanical ventilation  Consultations: PCCM transfer  Discharge Exam: Vitals:   08/16/21 0804 08/16/21 0816  BP:    Pulse:    Resp:    Temp:  98 F (36.7 C)  SpO2: 100%     Gen: Obese pleasant female awake,  Alert, Oriented X 3,  HEENT: no JVD Lungs: Improved air movement, no wheezes today CVS: S1S2/RRR Abd: soft, Non tender, non distended, BS present Extremities: No edema Skin: no new rashes on exposed skin   Discharge Instructions   Discharge Instructions     Ambulatory referral to Pulmonology   Complete by: As directed    Reason for referral: Asthma/COPD   Diet general   Complete by: As directed    Increase activity slowly   Complete by: As directed       Allergies as of 08/16/2021       Reactions   Coconut (cocos Nucifera) Allergy Skin Test Other (See Comments)   Justicia Adhatoda (malabar Nut Tree) [justicia Adhatoda] Other (See Comments)   Tree nuts   Shellfish Allergy Other (See Comments)        Medication List     STOP taking these medications    mometasone-formoterol 200-5 MCG/ACT Aero Commonly known as: DULERA       TAKE these medications    albuterol 108 (90 Base) MCG/ACT inhaler Commonly known as: VENTOLIN HFA Inhale 2 puffs into the lungs every 6 (six) hours as needed for wheezing.   budesonide-formoterol 160-4.5 MCG/ACT inhaler Commonly known as: Symbicort Inhale 2 puffs into the lungs 2 (two) times daily.   montelukast 10 MG tablet Commonly known as: SINGULAIR Take 1 tablet (10 mg total) by mouth at bedtime.   predniSONE 20 MG tablet Commonly known as: DELTASONE Take 1-2 tablets (20-40 mg total) by mouth daily with breakfast. Take  for 2days then  daily for 2days then STOP   Vitamin D (Ergocalciferol) 1.25 MG (50000 UNIT) Caps capsule Commonly known as: DRISDOL Take 50,000 Units  by mouth once a week.       Allergies  Allergen Reactions   Coconut (Cocos Nucifera) Allergy Skin Test Other (See Comments)   Justicia Adhatoda (Malabar Nut Tree) [Justicia Adhatoda] Other (See Comments)    Tree nuts   Shellfish Allergy Other (See Comments)    Follow-up Information     PCP Follow up in 2 week(s).                   The  results of significant diagnostics from this hospitalization (including imaging, microbiology, ancillary and laboratory) are listed below for reference.    Significant Diagnostic Studies: DG Chest Port 1 View  Result Date: 08/11/2021 CLINICAL DATA:  Check endotracheal tube placement EXAM: PORTABLE CHEST 1 VIEW COMPARISON:  Film from earlier in the same day. FINDINGS: Endotracheal tube and gastric catheter are noted in satisfactory position. The cardiac shadow is within normal limits. Lungs are clear. No bony abnormality is noted. IMPRESSION: Tubes and lines as described.  No acute abnormality noted. Electronically Signed   By: Alcide Clever M.D.   On: 08/11/2021 22:41   DG Chest Portable 1 View  Result Date: 08/11/2021 CLINICAL DATA:  Shortness of breath EXAM: PORTABLE CHEST 1 VIEW COMPARISON:  07/17/2020 FINDINGS: The heart size and mediastinal contours are within normal limits. Both lungs are clear. The visualized skeletal structures are unremarkable. IMPRESSION: No active disease. Electronically Signed   By: Ernie Avena M.D.   On: 08/11/2021 11:04   ECHOCARDIOGRAM COMPLETE  Result Date: 08/12/2021    ECHOCARDIOGRAM REPORT   Patient Name:   Brenda Acevedo Date of Exam: 08/12/2021 Medical Rec #:  [id number]         Height:       62.0 in Accession #:    [id number]        Weight:       183.9 lb Date of Birth:  04/17/1996          BSA:          1.845 m Patient Age:    25 years          BP:           95/53 mmHg Patient Gender: F                 HR:           122 bpm. Exam Location:  Inpatient Procedure: 2D Echo, Color Doppler and Cardiac Doppler Indications:    Chest pain  History:        Patient has no prior history of Echocardiogram examinations.  Sonographer:    Cleatis Polka Referring Phys: [id number] SUDHAM CHAND IMPRESSIONS  1. Left ventricular ejection fraction, by estimation, is 65 to 70%. The left ventricle has normal function. The left ventricle has no regional wall motion abnormalities.  Left ventricular diastolic parameters were normal.  2. Right ventricular systolic function is normal. The right ventricular size is normal.  3. The mitral valve is normal in structure. No evidence of mitral valve regurgitation. No evidence of mitral stenosis.  4. The aortic valve is normal in structure. Aortic valve regurgitation is not visualized. No aortic stenosis is present.  5. The inferior vena cava is normal in size with greater than 50% respiratory variability, suggesting right atrial pressure of 3 mmHg. FINDINGS  Left Ventricle: Left ventricular ejection fraction, by estimation, is 65 to 70%. The left ventricle has normal function. The left ventricle has no regional wall motion abnormalities. The left  ventricular internal cavity size was normal in size. There is  no left ventricular hypertrophy. Left ventricular diastolic parameters were normal. Right Ventricle: The right ventricular size is normal. No increase in right ventricular wall thickness. Right ventricular systolic function is normal. Left Atrium: Left atrial size was normal in size. Right Atrium: Right atrial size was normal in size. Pericardium: There is no evidence of pericardial effusion. Mitral Valve: The mitral valve is normal in structure. No evidence of mitral valve regurgitation. No evidence of mitral valve stenosis. Tricuspid Valve: The tricuspid valve is normal in structure. Tricuspid valve regurgitation is not demonstrated. No evidence of tricuspid stenosis. Aortic Valve: The aortic valve is normal in structure. Aortic valve regurgitation is not visualized. No aortic stenosis is present. Aortic valve mean gradient measures 9.0 mmHg. Aortic valve peak gradient measures 17.1 mmHg. Aortic valve area, by VTI measures 2.57 cm. Pulmonic Valve: The pulmonic valve was normal in structure. Pulmonic valve regurgitation is not visualized. No evidence of pulmonic stenosis. Aorta: The aortic root is normal in size and structure. Venous: The  inferior vena cava is normal in size with greater than 50% respiratory variability, suggesting right atrial pressure of 3 mmHg. IAS/Shunts: No atrial level shunt detected by color flow Doppler.  LEFT VENTRICLE PLAX 2D LVIDd:         3.60 cm      Diastology LVIDs:         2.10 cm      LV e' medial:    12.70 cm/s LV PW:         1.00 cm      LV E/e' medial:  7.6 LV IVS:        0.90 cm      LV e' lateral:   10.80 cm/s LVOT diam:     1.80 cm      LV E/e' lateral: 8.9 LV SV:         66 LV SV Index:   36 LVOT Area:     2.54 cm  LV Volumes (MOD) LV vol d, MOD A2C: 92.8 ml LV vol d, MOD A4C: 108.0 ml LV vol s, MOD A2C: 31.3 ml LV vol s, MOD A4C: 37.0 ml LV SV MOD A2C:     61.5 ml LV SV MOD A4C:     108.0 ml LV SV MOD BP:      65.8 ml RIGHT VENTRICLE             IVC RV Basal diam:  2.80 cm     IVC diam: 1.80 cm RV Mid diam:    1.90 cm RV S prime:     16.00 cm/s TAPSE (M-mode): 2.6 cm LEFT ATRIUM             Index        RIGHT ATRIUM          Index LA diam:        2.70 cm 1.46 cm/m   RA Area:     9.71 cm LA Vol (A2C):   19.6 ml 10.63 ml/m  RA Volume:   18.50 ml 10.03 ml/m LA Vol (A4C):   20.2 ml 10.95 ml/m LA Biplane Vol: 21.8 ml 11.82 ml/m  AORTIC VALVE AV Area (Vmax):    2.05 cm AV Area (Vmean):   2.03 cm AV Area (VTI):     2.57 cm AV Vmax:           207.00 cm/s AV Vmean:  147.000 cm/s AV VTI:            0.256 m AV Peak Grad:      17.1 mmHg AV Mean Grad:      9.0 mmHg LVOT Vmax:         167.00 cm/s LVOT Vmean:        117.000 cm/s LVOT VTI:          0.259 m LVOT/AV VTI ratio: 1.01  AORTA Ao Root diam: 2.20 cm Ao Asc diam:  2.80 cm MITRAL VALVE MV Area (PHT): 6.27 cm     SHUNTS MV Decel Time: 121 msec     Systemic VTI:  0.26 m MV E velocity: 96.10 cm/s   Systemic Diam: 1.80 cm MV A velocity: 111.00 cm/s MV E/A ratio:  0.87 Charlton Haws MD Electronically signed by Charlton Haws MD Signature Date/Time: 08/12/2021/10:38:27 AM    Final     Microbiology: Recent Results (from the past 240 hour(s))  Resp Panel  by RT-PCR (Flu A&B, Covid) Nasopharyngeal Swab     Status: None   Collection Time: 08/11/21 10:51 AM   Specimen: Nasopharyngeal Swab; Nasopharyngeal(NP) swabs in vial transport medium  Result Value Ref Range Status   SARS Coronavirus 2 by RT PCR NEGATIVE NEGATIVE Final    Comment: (NOTE) SARS-CoV-2 target nucleic acids are NOT DETECTED.  The SARS-CoV-2 RNA is generally detectable in upper respiratory specimens during the acute phase of infection. The lowest concentration of SARS-CoV-2 viral copies this assay can detect is 138 copies/mL. A negative result does not preclude SARS-Cov-2 infection and should not be used as the sole basis for treatment or other patient management decisions. A negative result may occur with  improper specimen collection/handling, submission of specimen other than nasopharyngeal swab, presence of viral mutation(s) within the areas targeted by this assay, and inadequate number of viral copies(<138 copies/mL). A negative result must be combined with clinical observations, patient history, and epidemiological information. The expected result is Negative.  Fact Sheet for Patients:  BloggerCourse.com  Fact Sheet for Healthcare Providers:  SeriousBroker.it  This test is no t yet approved or cleared by the [place] FDA and  has been authorized for detection and/or diagnosis of SARS-CoV-2 by FDA under an Emergency Use Authorization (EUA). This EUA will remain  in effect (meaning this test can be used) for the duration of the COVID-19 declaration under Section 564(b)(1) of the Act, 21 U.S.C.section 360bbb-3(b)(1), unless the authorization is terminated  or revoked sooner.       Influenza A by PCR NEGATIVE NEGATIVE Final   Influenza B by PCR NEGATIVE NEGATIVE Final    Comment: (NOTE) The Xpert Xpress SARS-CoV-2/FLU/RSV plus assay is intended as an aid in the diagnosis of influenza from Nasopharyngeal swab  specimens and should not be used as a sole basis for treatment. Nasal washings and aspirates are unacceptable for Xpert Xpress SARS-CoV-2/FLU/RSV testing.  Fact Sheet for Patients: BloggerCourse.com  Fact Sheet for Healthcare Providers: SeriousBroker.it  This test is not yet approved or cleared by the [place] FDA and has been authorized for detection and/or diagnosis of SARS-CoV-2 by FDA under an Emergency Use Authorization (EUA). This EUA will remain in effect (meaning this test can be used) for the duration of the COVID-19 declaration under Section 564(b)(1) of the Act, 21 U.S.C. section 360bbb-3(b)(1), unless the authorization is terminated or revoked.  Performed at [facility], [address] [place], [place] [address]   Respiratory (~20 pathogens) panel by PCR  Status: Abnormal   Collection Time: 08/11/21 10:51 AM   Specimen: Nasopharyngeal Swab; Respiratory  Result Value Ref Range Status   Adenovirus NOT DETECTED NOT DETECTED Final   Coronavirus 229E NOT DETECTED NOT DETECTED Final    Comment: (NOTE) The Coronavirus on the Respiratory Panel, DOES NOT test for the novel  Coronavirus (2019 nCoV)    Coronavirus HKU1 NOT DETECTED NOT DETECTED Final   Coronavirus NL63 NOT DETECTED NOT DETECTED Final   Coronavirus OC43 NOT DETECTED NOT DETECTED Final   Metapneumovirus NOT DETECTED NOT DETECTED Final   Rhinovirus / Enterovirus DETECTED (A) NOT DETECTED Final   Influenza A NOT DETECTED NOT DETECTED Final   Influenza B NOT DETECTED NOT DETECTED Final   Parainfluenza Virus 1 NOT DETECTED NOT DETECTED Final   Parainfluenza Virus 2 NOT DETECTED NOT DETECTED Final   Parainfluenza Virus 3 NOT DETECTED NOT DETECTED Final   Parainfluenza Virus 4 NOT DETECTED NOT DETECTED Final   Respiratory Syncytial Virus NOT DETECTED NOT DETECTED Final   Bordetella pertussis NOT DETECTED NOT DETECTED Final   Bordetella  Parapertussis NOT DETECTED NOT DETECTED Final   Chlamydophila pneumoniae NOT DETECTED NOT DETECTED Final   Mycoplasma pneumoniae NOT DETECTED NOT DETECTED Final    Comment: Performed at [facility], [address], [place], [place] [address]  MRSA Next Gen by PCR, Nasal     Status: None   Collection Time: 08/11/21 10:23 PM   Specimen: Nasal Mucosa; Nasal Swab  Result Value Ref Range Status   MRSA by PCR Next Gen NOT DETECTED NOT DETECTED Final    Comment: (NOTE) The GeneXpert MRSA Assay (FDA approved for NASAL specimens only), is one component of a comprehensive MRSA colonization surveillance program. It is not intended to diagnose MRSA infection nor to guide or monitor treatment for MRSA infections. Test performance is not FDA approved in patients less than 42 years old. Performed at [facility], [address] [place], [place] [address]      Labs: Basic Metabolic Panel: Recent Labs  Lab 08/11/21 1051 08/11/21 1111 08/12/21 0212 08/12/21 0320 08/12/21 1618 08/12/21 2019 08/13/21 0616 08/13/21 1714 08/14/21 1052 08/15/21 0250  NA 136   < > 135 136  --  135  --   --  137 134*  K 3.6   < > 4.3 4.5  --  4.5  --   --  4.4 4.4  CL 102  --   --  103  --   --   --   --  103 105  CO2 23  --   --  17*  --   --   --   --  24 22  GLUCOSE 210*  --   --  232*  --   --   --   --  139* 147*  BUN 7  --   --  17  --   --   --   --  17 18  CREATININE 1.00  --   --  2.09*  --   --   --   --  0.82 0.79  CALCIUM 8.3*  --   --  8.8*  --   --   --   --  9.1 8.7*  MG  --   --   --  3.0* 2.3  --  2.3 2.3  --   --   PHOS  --   --   --  4.0 3.0  --  1.5*  3.1  --   --    < > = values in this interval not displayed.   Liver Function Tests: Recent Labs  Lab 08/11/21 1051  AST 24  ALT 33  ALKPHOS 96  BILITOT <0.1*  PROT 7.0  ALBUMIN 3.7   No results for input(s): LIPASE, AMYLASE in the last 168 hours. No results for input(s): AMMONIA in the last 168 hours. CBC: Recent Labs   Lab 08/11/21 1051 08/11/21 1111 08/12/21 0212 08/12/21 0320 08/12/21 2019 08/14/21 1052 08/15/21 0250  WBC 20.0*  --   --  12.3*  --  17.7* 13.9*  NEUTROABS 18.4*  --   --   --   --   --   --   HGB 12.8   < > 13.3 12.2 11.2* 11.8* 12.3  HCT 41.8   < > 39.0 39.9 33.0* 37.7 39.0  MCV 78.4*  --   --  79.0*  --  76.3* 76.2*  PLT 427*  --   --  422*  --  430* 437*   < > = values in this interval not displayed.   Cardiac Enzymes: No results for input(s): CKTOTAL, CKMB, CKMBINDEX, TROPONINI in the last 168 hours. BNP: BNP (last 3 results) Recent Labs    08/11/21 1051  BNP 32.2    ProBNP (last 3 results) No results for input(s): PROBNP in the last 8760 hours.  CBG: Recent Labs  Lab 08/13/21 0304 08/13/21 0718 08/13/21 1101 08/13/21 1541 08/13/21 2306  GLUCAP 166* 158* 121* 147* 105*       Signed:  Zannie Cove MD.  [facility] 08/16/2021, 10:20 AM

## 2021-10-21 ENCOUNTER — Ambulatory Visit: Payer: BC Managed Care – PPO | Admitting: Internal Medicine

## 2021-10-21 ENCOUNTER — Other Ambulatory Visit: Payer: Self-pay

## 2021-10-21 ENCOUNTER — Encounter: Payer: Self-pay | Admitting: Internal Medicine

## 2021-10-21 VITALS — BP 116/74 | HR 92 | Ht 62.0 in | Wt 189.0 lb

## 2021-10-21 DIAGNOSIS — J4550 Severe persistent asthma, uncomplicated: Secondary | ICD-10-CM

## 2021-10-21 DIAGNOSIS — J301 Allergic rhinitis due to pollen: Secondary | ICD-10-CM

## 2021-10-21 LAB — POCT EXHALED NITRIC OXIDE: FeNO level (ppb): 27

## 2021-10-21 NOTE — Progress Notes (Signed)
? ?      ?Brenda Acevedo    [id number]    [birth year] ? ?Primary Care Physician:[facility] ? ?Referring Physician: Zannie Cove, MD ?[address] ?[phone] ?[place],  [place] [address] ?Reason for Consultation: asthma ?Date of Consultation: 10/21/2021 ? ?Chief complaint:   ?Chief Complaint  ?Patient presents with  ? Consult  ?  Referred by hospital for asthma. Was in the hospital back in December 2022. States her breathing has improved.   ?  ? ?HPI: ?Brenda Acevedo is a 26 y.o. woman who presents for new patient evaluation for severe asthma. Diagnosed in childhood. Then was in remission for many years after middle school.  ? ?Symptoms resumed in early 2020 and she took prednisone. Since then.  Has had two hospitalizations in the last 2 years for asthma exacerbations.  ? ?Was hospitalized and intubated in Dec 2022. Possible RSV trigger. At that time she was not on symbicort regularly.  ? ?Current Regimen: symbicort, singulair ?Asthma Triggers: Symptoms worse with URIs and seasonal allergies, exertion ?Exacerbations in the last year: twice ?History of hospitalization or intubation: hospitalized twice, required intubation in 2022 December.  ?Allergy Testing: pet dander, never had allergy testing ?GERD: none ?Allergic Rhinitis: yes on singulair, zyrtec ?ACT:  ?Asthma Control Test ACT Total Score  ?10/21/2021 21  ? ?FeNO: 27 ppb done march 2023 ? ?Multiple family members with asthma including cousins aunts and uncles ? ?Social history: ? 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  ?Exposures: lives at home with boyfriend, pet dog ?Smoking history: never smoker ? ?Social History  ? ?Occupational History  ? Not on file  ?Tobacco Use  ? Smoking status: Never  ? Smokeless tobacco: Never  ?Substance and Sexual Activity  ? Alcohol use: Yes  ?  Comment: occ  ? Drug use: No  ? Sexual activity: Not on file  ? ? ?Relevant family history: ? ?Family History  ?Problem Relation Age of Onset   ? Asthma Cousin   ? ? ?Past Medical History:  ?Diagnosis Date  ? Asthma   ? ? ?History reviewed. No pertinent surgical history. ? ? ?Physical Exam: ?Blood pressure 116/74, pulse 92, height 5\' 2"  (1.575 m), weight 189 lb (85.7 kg), SpO2 98 %. ?Gen:      No acute distress ?ENT:  mildly inflammed nasal turbinades, mallampati IV, no nasal polyps, mucus membranes moist ?Lungs:    No increased respiratory effort, symmetric chest wall excursion, clear to auscultation bilaterally, no wheezes or crackles ?CV:         Regular rate and rhythm; no murmurs, rubs, or gallops.  No pedal edema ?Abd:      + bowel sounds; soft, non-tender; no distension ?MSK: no acute synovitis of DIP or PIP joints, no mechanics hands.  ?Skin:      Warm and dry; no rashes ?Neuro: normal speech, no focal facial asymmetry ?Psych: alert and oriented x3, normal mood and affect ? ? ?Data Reviewed/Medical Decision Making: ? ?Independent interpretation of tests: ?Imaging: ? Review of patient's chest xray images dec 2022 revealed no acute process. The patient's images have been independently reviewed by me.   ? ?PFTs: ?I have personally reviewed the patient's PFTs and spirometry today shows no airflow limitation, however she could not maintain exhalation for 6 seconds and thus did not meet ats criteria.  ?No flowsheet data found. ? ?Labs: no peripheral eosinophilia.  ?Lab Results  ?Component Value Date  ? WBC 13.9 (H) 08/15/2021  ?  HGB 12.3 08/15/2021  ? HCT 39.0 08/15/2021  ? MCV 76.2 (L) 08/15/2021  ? PLT 437 (H) 08/15/2021  ? ?Lab Results  ?Component Value Date  ? NA 134 (L) 08/15/2021  ? K 4.4 08/15/2021  ? CL 105 08/15/2021  ? CO2 22 08/15/2021  ? ? ? ?Immunization status:  ? ?There is no immunization history on file for this patient. ? ? I reviewed prior external note(s) from hospital stay ? I reviewed the result(s) of the labs and imaging as noted above.  ? I have ordered spirometry ? ?Assessment:  ?Severe persistent asthma with history of  intubation ?Allergic rhinitis ? ? ?Plan/Recommendations: ? ?Both times she was hospitalized she was not on maintenance medication.  ?Would continue symbicort for now with prn albuterol.  ?Continue singulair and cetirizine. Add flonase as needed.  ?Obtained spirometry and FENO today.  ? ? ?We discussed disease management and progression at length today.  ? ? ?Return to Care: ?Return in about 6 months (around 04/23/2022). ? ?Durel Salts, MD ?Pulmonary and Critical Care Medicine ?[facility] ?Office:[phone] ? ?CC: Zannie Cove, MD ? ? ? ?

## 2021-10-21 NOTE — Patient Instructions (Signed)
Please schedule follow up scheduled with myself in 6 months.  If my schedule is not open yet, we will contact you with a reminder closer to that time. Please call [phone] if you haven't heard from [place] a month before.   Before your next visit I would like you to have: Spirometry/Feno Blood work for allergies.   Keep taking symbicort twice a day, even on a good day. Gargle after use.  Continue albuterol as needed.   Continue singulair and zyrtec. Would add flonase if allergy symptoms worsen.  Let me know if asthma symptoms worsen prior to our follow up - we can see you sooner.   By learning about asthma and how it can be controlled, you take an important step toward managing this disease. Work closely with your asthma care team to learn all you can about your asthma, how to avoid triggers, what your medications do, and how to take them correctly. With proper care, you can live free of asthma symptoms and maintain a normal, healthy lifestyle.   What is asthma? Asthma is a chronic disease that affects the airways of the lungs. During normal breathing, the bands of muscle that surround the airways are relaxed and air moves freely. During an asthma episode or "attack," there are three main changes that stop air from moving easily through the airways: The bands of muscle that surround the airways tighten and make the airways narrow. This tightening is called bronchospasm.  The lining of the airways becomes swollen or inflamed.  The cells that line the airways produce more mucus, which is thicker than normal and clogs the airways.  These three factors - bronchospasm, inflammation, and mucus production - cause symptoms such as difficulty breathing, wheezing, and coughing.  What are the most common symptoms of asthma? Asthma symptoms are not the same for everyone. They can even change from episode to episode in the same person. Also, you may have only one symptom of asthma, such as cough, but  another person may have all the symptoms of asthma. It is important to know all the symptoms of asthma and to be aware that your asthma can present in any of these ways at any time. The most common symptoms include: Coughing, especially at night  Shortness of breath  Wheezing  Chest tightness, pain, or pressure   Who is affected by asthma? Asthma affects 22 million Americans; about 6 million of these are children under age 77. People who have a family history of asthma have an increased risk of developing the disease. Asthma is also more common in people who have allergies or who are exposed to tobacco smoke. However, anyone can develop asthma at any time. Some people may have asthma all of their lives, while others may develop it as adults.  What causes asthma? The airways in a person with asthma are very sensitive and react to many things, or "triggers." Contact with these triggers causes asthma symptoms. One of the most important parts of asthma control is to identify your triggers and then avoid them when possible. The only trigger you do not want to avoid is exercise. Pre-treatment with medicines before exercise can allow you to stay active yet avoid asthma symptoms. Common asthma triggers include: Infections (colds, viruses, flu, sinus infections)  Exercise  Weather (changes in temperature and/or humidity, cold air)  Tobacco smoke  Allergens (dust mites, pollens, pets, mold spores, cockroaches, and sometimes foods)  Irritants (strong odors from cleaning products, perfume, wood smoke, air  pollution)  Strong emotions such as crying or laughing hard  Some medications   How is asthma diagnosed? To diagnose asthma, your doctor will first review your medical history, family history, and symptoms. Your doctor will want to know any past history of breathing problems you may have had, as well as a family history of asthma, allergies, eczema (a bumpy, itchy skin rash caused by allergies), or other  lung disease. It is important that you describe your symptoms in detail (cough, wheeze, shortness of breath, chest tightness), including when and how often they occur. The doctor will perform a physical examination and listen to your heart and lungs. He or she may also order breathing tests, allergy tests, blood tests, and chest and sinus X-rays. The tests will find out if you do have asthma and if there are any other conditions that are contributing factors.  How is asthma treated? Asthma can be controlled, but not cured. It is not normal to have frequent symptoms, trouble sleeping, or trouble completing tasks. Appropriate asthma care will prevent symptoms and visits to the emergency room and hospital. Asthma medicines are one of the mainstays of asthma treatment. The drugs used to treat asthma are explained below.  Anti-inflammatories: These are the most important drugs for most people with asthma. Anti-inflammatory drugs reduce swelling and mucus production in the airways. As a result, airways are less sensitive and less likely to react to triggers. These medications need to be taken daily and may need to be taken for several weeks before they begin to control asthma. Anti-inflammatory medicines lead to fewer symptoms, better airflow, less sensitive airways, less airway damage, and fewer asthma attacks. If taken every day, they CONTROL or prevent asthma symptoms.   Bronchodilators: These drugs relax the muscle bands that tighten around the airways. This action opens the airways, letting more air in and out of the lungs and improving breathing. Bronchodilators also help clear mucus from the lungs. As the airways open, the mucus moves more freely and can be coughed out more easily. In short-acting forms, bronchodilators RELIEVE or stop asthma symptoms by quickly opening the airways and are very helpful during an asthma episode. In long-acting forms, bronchodilators provide CONTROL of asthma symptoms and  prevent asthma episodes.  Asthma drugs can be taken in a variety of ways. Inhaling the medications by using a metered dose inhaler, dry powder inhaler, or nebulizer is one way of taking asthma medicines. Oral medicines (pills or liquids you swallow) may also be prescribed.  Asthma severity Asthma is classified as either "intermittent" (comes and goes) or "persistent" (lasting). Persistent asthma is further described as being mild, moderate, or severe. The severity of asthma is based on how often you have symptoms both during the day and night, as well as by the results of lung function tests and by how well you can perform activities. The "severity" of asthma refers to how "intense" or "strong" your asthma is.  Asthma control Asthma control is the goal of asthma treatment. Regardless of your asthma severity, it may or may not be controlled. Asthma control means: You are able to do everything you want to do at work and home  You have no (or minimal) asthma symptoms  You do not wake up from your sleep or earlier than usual in the morning due to asthma  You rarely need to use your reliever medicine (inhaler)  Another major part of your treatment is that you are happy with your asthma care and believe your  asthma is controlled.  Monitoring symptoms A key part of treatment is keeping track of how well your lungs are working. Monitoring your symptoms  what they are, how and when they happen, and how severe they are  is an important part of being able to control your asthma.  Sometimes asthma is monitored using a peak flow meter. A peak flow (PF) meter measures how fast the air comes out of your lungs. It can help you know when your asthma is getting worse, sometimes even before you have symptoms. By taking daily peak flow readings, you can learn when to adjust medications to keep asthma under good control. It is also used to create your asthma action plan (see below). Your doctor can use your peak  flow readings to adjust your treatment plan in some cases.  Asthma Action Plan Based on your history and asthma severity, you and your doctor will develop a care plan called an asthma action plan. The asthma action plan describes when and how to use your medicines, actions to take when asthma worsens, and when to seek emergency care. Make sure you understand this plan. If you do not, ask your asthma care provider any questions you may have. Your asthma action plan is one of the keys to controlling asthma. Keep it readily available to remind you of what you need to do every day to control asthma and what you need to do when symptoms occur.  Goals of asthma therapy These are the goals of asthma treatment: Live an active, normal life  Prevent chronic and troublesome symptoms  Attend work or school every day  Perform daily activities without difficulty  Stop urgent visits to the doctor, emergency department, or hospital  Use and adjust medications to control asthma with few or no side effects

## 2022-08-27 IMAGING — DX DG CHEST 1V PORT
1 series · 1 of 1 positions shown · non-contrast
Comparison: 07/17/2020

CLINICAL DATA: Shortness of breath

EXAM:
PORTABLE CHEST 1 VIEW

[chest]
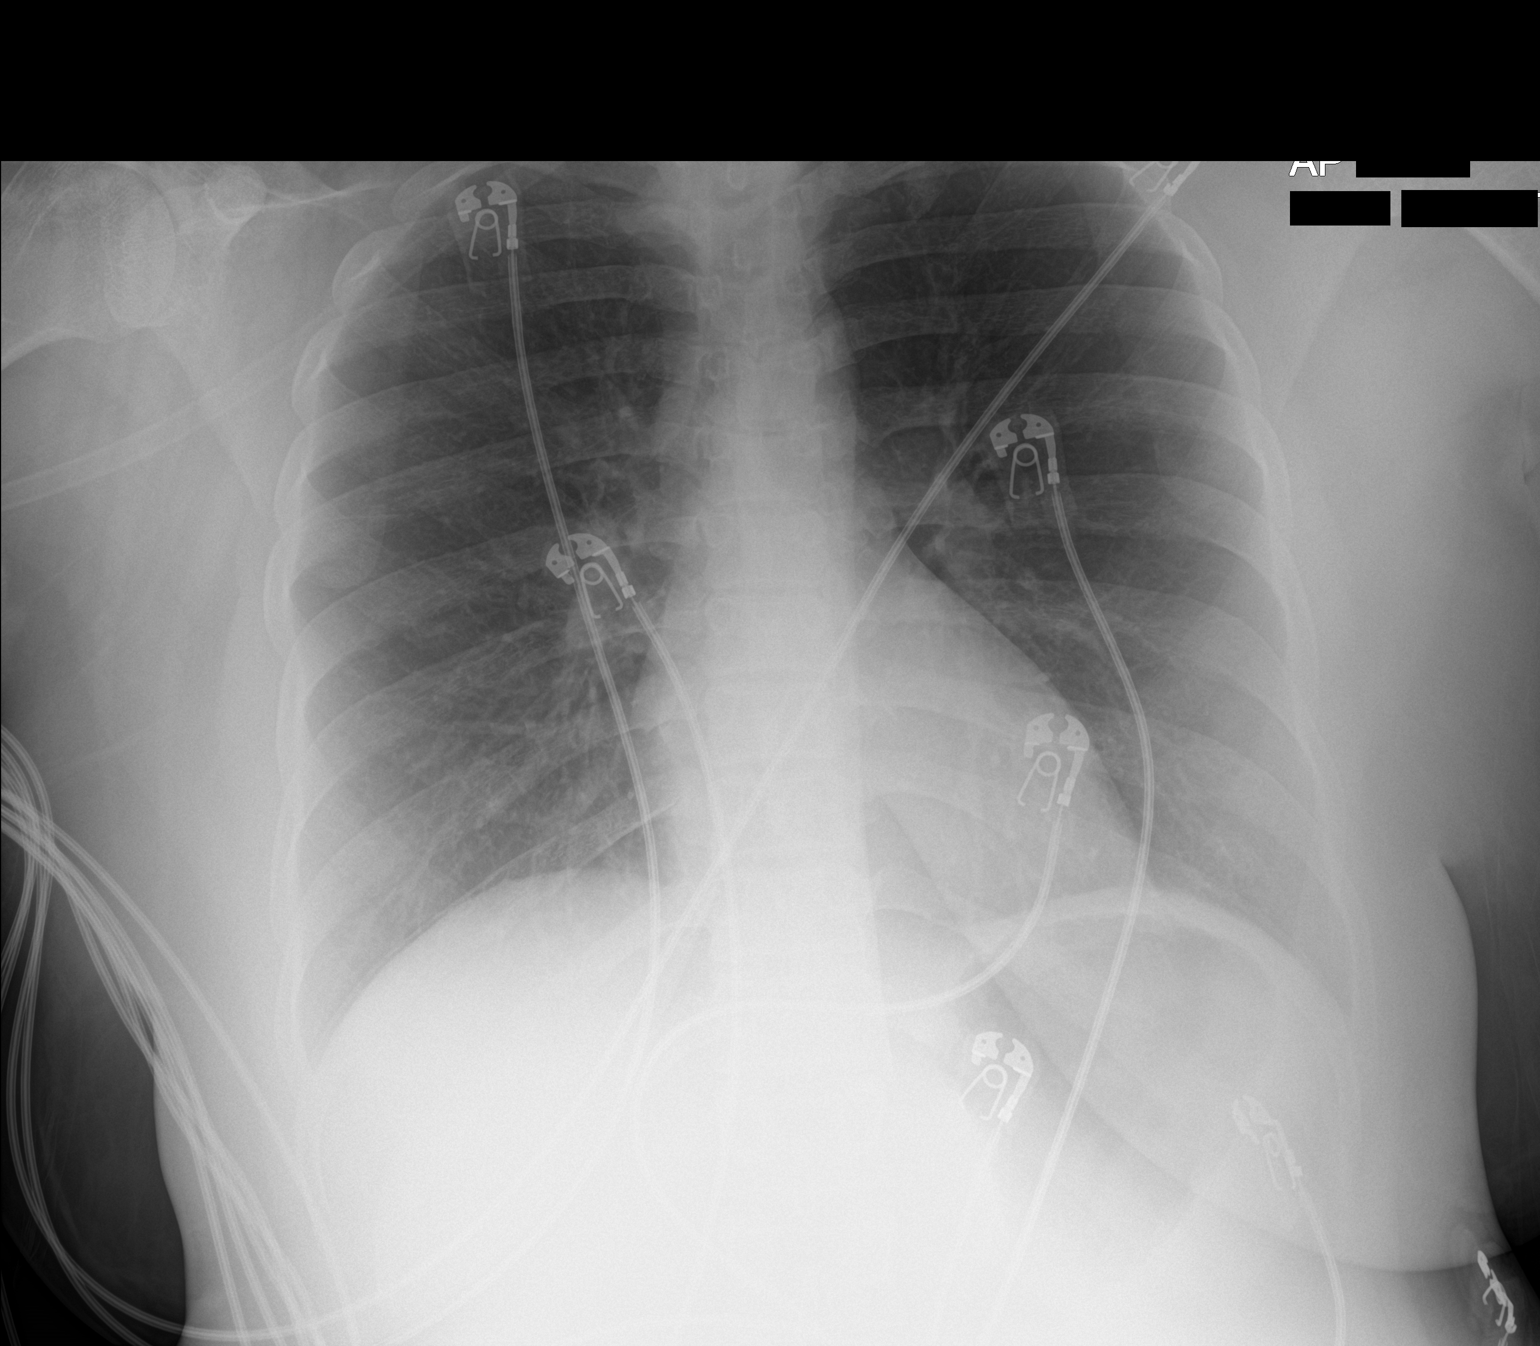

[1 of 1 positions shown; findings below may reference images not displayed]

FINDINGS: The heart size and mediastinal contours are within normal limits.
Both lungs are clear. The visualized skeletal structures are
unremarkable.
IMPRESSION: No active disease.
